# Patient Record
Sex: Female | Born: 1985
Health system: Southern US, Community
[De-identification: ages and names within clinical notes are randomized; demographics above are authoritative.]

## PROBLEM LIST (undated history)

## (undated) DIAGNOSIS — C801 Malignant (primary) neoplasm, unspecified: Secondary | ICD-10-CM

## (undated) DIAGNOSIS — F419 Anxiety disorder, unspecified: Secondary | ICD-10-CM

## (undated) DIAGNOSIS — R519 Headache, unspecified: Secondary | ICD-10-CM

## (undated) HISTORY — DX: Anxiety disorder, unspecified: F41.9

## (undated) HISTORY — DX: Malignant (primary) neoplasm, unspecified: C80.1

## (undated) HISTORY — DX: Headache, unspecified: R51.9

## (undated) HISTORY — PX: OTHER SURGICAL HISTORY: SHX169

---

## 2015-11-05 DIAGNOSIS — C4031 Malignant neoplasm of short bones of right lower limb: Secondary | ICD-10-CM | POA: Insufficient documentation

## 2016-08-17 DIAGNOSIS — G43109 Migraine with aura, not intractable, without status migrainosus: Secondary | ICD-10-CM | POA: Insufficient documentation

## 2019-12-19 DIAGNOSIS — F32A Depression, unspecified: Secondary | ICD-10-CM | POA: Diagnosis not present

## 2019-12-19 DIAGNOSIS — Z6823 Body mass index (BMI) 23.0-23.9, adult: Secondary | ICD-10-CM | POA: Diagnosis not present

## 2020-01-31 DIAGNOSIS — H9201 Otalgia, right ear: Secondary | ICD-10-CM | POA: Diagnosis not present

## 2020-01-31 DIAGNOSIS — H9202 Otalgia, left ear: Secondary | ICD-10-CM | POA: Diagnosis not present

## 2020-01-31 DIAGNOSIS — Z20828 Contact with and (suspected) exposure to other viral communicable diseases: Secondary | ICD-10-CM | POA: Diagnosis not present

## 2020-01-31 DIAGNOSIS — R051 Acute cough: Secondary | ICD-10-CM | POA: Diagnosis not present

## 2020-01-31 DIAGNOSIS — J0100 Acute maxillary sinusitis, unspecified: Secondary | ICD-10-CM | POA: Diagnosis not present

## 2020-02-20 DIAGNOSIS — R102 Pelvic and perineal pain: Secondary | ICD-10-CM | POA: Diagnosis not present

## 2020-03-17 DIAGNOSIS — Z20828 Contact with and (suspected) exposure to other viral communicable diseases: Secondary | ICD-10-CM | POA: Diagnosis not present

## 2020-03-17 DIAGNOSIS — J0100 Acute maxillary sinusitis, unspecified: Secondary | ICD-10-CM | POA: Diagnosis not present

## 2020-03-22 DIAGNOSIS — H6501 Acute serous otitis media, right ear: Secondary | ICD-10-CM | POA: Diagnosis not present

## 2020-04-24 DIAGNOSIS — Z309 Encounter for contraceptive management, unspecified: Secondary | ICD-10-CM | POA: Diagnosis not present

## 2020-04-24 DIAGNOSIS — Z6823 Body mass index (BMI) 23.0-23.9, adult: Secondary | ICD-10-CM | POA: Diagnosis not present

## 2020-04-24 DIAGNOSIS — G43909 Migraine, unspecified, not intractable, without status migrainosus: Secondary | ICD-10-CM | POA: Diagnosis not present

## 2020-04-24 DIAGNOSIS — F32A Depression, unspecified: Secondary | ICD-10-CM | POA: Diagnosis not present

## 2020-05-13 DIAGNOSIS — Z30432 Encounter for removal of intrauterine contraceptive device: Secondary | ICD-10-CM | POA: Diagnosis not present

## 2020-05-13 LAB — HM PAP SMEAR: HM Pap smear: NORMAL

## 2020-05-16 DIAGNOSIS — J206 Acute bronchitis due to rhinovirus: Secondary | ICD-10-CM | POA: Diagnosis not present

## 2020-05-24 DIAGNOSIS — F32A Depression, unspecified: Secondary | ICD-10-CM | POA: Diagnosis not present

## 2020-05-24 DIAGNOSIS — G43909 Migraine, unspecified, not intractable, without status migrainosus: Secondary | ICD-10-CM | POA: Diagnosis not present

## 2020-06-16 DIAGNOSIS — G43009 Migraine without aura, not intractable, without status migrainosus: Secondary | ICD-10-CM | POA: Diagnosis not present

## 2020-07-12 DIAGNOSIS — G43909 Migraine, unspecified, not intractable, without status migrainosus: Secondary | ICD-10-CM | POA: Diagnosis not present

## 2020-07-12 DIAGNOSIS — F410 Panic disorder [episodic paroxysmal anxiety] without agoraphobia: Secondary | ICD-10-CM | POA: Diagnosis not present

## 2020-09-18 DIAGNOSIS — R110 Nausea: Secondary | ICD-10-CM | POA: Diagnosis not present

## 2020-09-18 DIAGNOSIS — Z8669 Personal history of other diseases of the nervous system and sense organs: Secondary | ICD-10-CM | POA: Diagnosis not present

## 2020-10-21 DIAGNOSIS — O30031 Twin pregnancy, monochorionic/diamniotic, first trimester: Secondary | ICD-10-CM | POA: Diagnosis not present

## 2020-10-21 DIAGNOSIS — O3661X0 Maternal care for excessive fetal growth, first trimester, not applicable or unspecified: Secondary | ICD-10-CM | POA: Diagnosis not present

## 2020-10-21 DIAGNOSIS — O3680X0 Pregnancy with inconclusive fetal viability, not applicable or unspecified: Secondary | ICD-10-CM | POA: Diagnosis not present

## 2020-10-21 DIAGNOSIS — Z3201 Encounter for pregnancy test, result positive: Secondary | ICD-10-CM | POA: Diagnosis not present

## 2020-10-21 DIAGNOSIS — Z3A10 10 weeks gestation of pregnancy: Secondary | ICD-10-CM | POA: Diagnosis not present

## 2020-10-21 DIAGNOSIS — Z3A09 9 weeks gestation of pregnancy: Secondary | ICD-10-CM | POA: Diagnosis not present

## 2020-10-21 DIAGNOSIS — Z3481 Encounter for supervision of other normal pregnancy, first trimester: Secondary | ICD-10-CM | POA: Diagnosis not present

## 2020-10-21 DIAGNOSIS — Z3689 Encounter for other specified antenatal screening: Secondary | ICD-10-CM | POA: Diagnosis not present

## 2020-10-21 LAB — OB RESULTS CONSOLE RUBELLA ANTIBODY, IGM: Rubella: NON-IMMUNE/NOT IMMUNE

## 2020-10-21 LAB — OB RESULTS CONSOLE ABO/RH: RH Type: NEGATIVE

## 2020-10-21 LAB — OB RESULTS CONSOLE HEPATITIS B SURFACE ANTIGEN: Hepatitis B Surface Ag: NEGATIVE

## 2020-10-21 LAB — OB RESULTS CONSOLE ANTIBODY SCREEN: Antibody Screen: NEGATIVE

## 2020-10-21 LAB — OB RESULTS CONSOLE RPR: RPR: NONREACTIVE

## 2020-10-21 LAB — OB RESULTS CONSOLE VARICELLA ZOSTER ANTIBODY, IGG: Varicella: IMMUNE

## 2020-10-21 LAB — OB RESULTS CONSOLE HIV ANTIBODY (ROUTINE TESTING): HIV: NONREACTIVE

## 2020-10-29 DIAGNOSIS — O30039 Twin pregnancy, monochorionic/diamniotic, unspecified trimester: Secondary | ICD-10-CM | POA: Insufficient documentation

## 2020-11-19 DIAGNOSIS — O30032 Twin pregnancy, monochorionic/diamniotic, second trimester: Secondary | ICD-10-CM | POA: Diagnosis not present

## 2020-11-19 DIAGNOSIS — Z3A15 15 weeks gestation of pregnancy: Secondary | ICD-10-CM | POA: Diagnosis not present

## 2020-12-13 ENCOUNTER — Encounter: Payer: Self-pay | Admitting: Family Medicine

## 2020-12-13 ENCOUNTER — Other Ambulatory Visit: Payer: Self-pay

## 2020-12-13 ENCOUNTER — Ambulatory Visit (INDEPENDENT_AMBULATORY_CARE_PROVIDER_SITE_OTHER): Payer: BC Managed Care – PPO | Admitting: Family Medicine

## 2020-12-13 VITALS — BP 111/58 | HR 106 | Ht 59.0 in | Wt 130.0 lb

## 2020-12-13 DIAGNOSIS — Z3A18 18 weeks gestation of pregnancy: Secondary | ICD-10-CM

## 2020-12-13 DIAGNOSIS — R002 Palpitations: Secondary | ICD-10-CM | POA: Diagnosis not present

## 2020-12-13 DIAGNOSIS — O0990 Supervision of high risk pregnancy, unspecified, unspecified trimester: Secondary | ICD-10-CM | POA: Insufficient documentation

## 2020-12-13 DIAGNOSIS — O30039 Twin pregnancy, monochorionic/diamniotic, unspecified trimester: Secondary | ICD-10-CM

## 2020-12-13 MED ORDER — ASPIRIN EC 81 MG PO TBEC: 81.0000 mg | DELAYED_RELEASE_TABLET | Freq: Every day | ORAL | 2 refills | Status: DC

## 2020-12-13 NOTE — Progress Notes (Signed)
Subjective:  Bonnie Bell is a [id number] LMP being seen today for her first obstetrical visit. [facility] but wanted to change because of conflicts with that OB.  Her pregnancy is complicated by mErin Hearingtwin pregnancy.  She has had 2 full-term spontaneous vaginal deliveries.  Those pregnancies were uncomplicated, as were the deliveries.  She denies medical problems.  Patient does intend to breast feed. Pregnancy history fully reviewed.  Patient reports  palpitations - has to stop when walking short distances and gets out of breath when walking up stairs. Heart rate climbs into the 150-160s .  BP (!) 111/58   Pulse (!) 106   Ht 4' 11" (1.499 m)   Wt 130 lb (59 kg)   LMP 08/06/2020   BMI 26.26 kg/m   HISTORY: OB History  Gravida Para Term Preterm AB Living  _0 SAB IAB Ectopic Multiple Live Births        1 2    # Outcome Date GA Lbr Len/2nd Weight Sex Delivery Anes PTL Lv  3 Current           2 Term 2019 458w0d M Vag-Spont EPI N LIV  1 Term 2010 [id number] Vag-Spont EPI N LIV    No past medical history on file.  Past Surgical History:  Procedure Laterality Date   surgery on right foot     Cancer    Family History  Problem Relation Age of Onset   Lung cancer Mother    Kidney disease Mother      Exam  BP (!) 111/58   Pulse (!) 106   Ht 4' 11" (1.499 m)   Wt 130 lb (59 kg)   LMP 08/06/2020   BMI 26.26 kg/m   Chaperone present during exam  CONSTITUTIONAL: Well-developed, well-nourished female in no acute distress.  HENT:  Normocephalic, atraumatic, External right and left ear normal. Oropharynx is clear and moist EYES: Conjunctivae and EOM are normal. Pupils are equal, round, and reactive to light. No scleral icterus.  NECK: Normal range of motion, supple, no masses.  Normal thyroid.  CARDIOVASCULAR: Normal heart rate noted, regular rhythm RESPIRATORY: Clear to auscultation bilaterally. Effort and breath  sounds normal, no problems with respiration noted. BREASTS: deferred ABDOMEN: Soft, normal bowel sounds, no distention noted.  No tenderness, rebound or guarding.  PELVIC: deferred MUSCULOSKELETAL: Normal range of motion. No tenderness.  No cyanosis, clubbing, or edema.  2+ distal pulses. SKIN: Skin is warm and dry. No rash noted. Not diaphoretic. No erythema. No pallor. NEUROLOGIC: Alert and oriented to person, place, and time. Normal reflexes, muscle tone coordination. No cranial nerve deficit noted. PSYCHIATRIC: Normal mood and affect. Normal behavior. Normal judgment and thought content.    Assessment:    Pregnancy: [id number] Active Problem List   Diagnosis Date Noted   Supervision of high risk pregnancy, antepartum 12/13/2020      Plan:   1. [redacted] weeks gestation of pregnancy - AFP, Serum, Open Spina Bifida - US KoreaM OB DETAIL ADDL GEST +14 WK; Future  2. Supervision of high risk pregnancy, antepartum FHT and FH normal. Prenatal record reviewed. Start ASA 87m57mUS MKorea OB DETAIL ADDL GEST +14 WK; Future  3. Monochorionic diamniotic twin pregnancy, antepartum Discussed serial US fKorea growth, dopplers for TTSS Discussed increasing dietary protein, increasing caloric intake - US [place] OB DETAIL ADDL GEST +14 WK;  Future  4. Palpitations Will check labs,  - TSH - Comp Met (CMET) - CBC - Magnesium    Continue prenatal vitamins Reviewed n/v relief measures and warning s/s to report Reviewed recommended weight gain based on pre-gravid BMI Encouraged well-balanced diet Ultrasound discussed; fetal survey: requested CCNC completed> form faxed if has or is planning to apply for medicaid The nature of [place] for [employer] with multiple MDs and other Advanced Practice Providers was explained to patient; also emphasized that fellows, residents, and students are part of our team.    Problem list reviewed and updated. 75% of 30 min visit spent on  counseling and coordination of care.     Truett Mainland 12/13/2020

## 2020-12-14 LAB — CBC
Hematocrit: 33 % — ABNORMAL LOW (ref 34.0–46.6)
Hemoglobin: 11 g/dL — ABNORMAL LOW (ref 11.1–15.9)
MCH: 28.9 pg (ref 26.6–33.0)
MCHC: 33.3 g/dL (ref 31.5–35.7)
MCV: 87 fL (ref 79–97)
Platelets: 225 10*3/uL (ref 150–450)
RBC: 3.81 x10E6/uL (ref 3.77–5.28)
RDW: 12.4 % (ref 11.7–15.4)
WBC: 9.4 10*3/uL (ref 3.4–10.8)

## 2020-12-14 LAB — COMPREHENSIVE METABOLIC PANEL
ALT: 16 IU/L (ref 0–32)
AST: 24 IU/L (ref 0–40)
Albumin/Globulin Ratio: 1.7 (ref 1.2–2.2)
Albumin: 3.5 g/dL — ABNORMAL LOW (ref 3.8–4.8)
Alkaline Phosphatase: 54 IU/L (ref 44–121)
BUN/Creatinine Ratio: 9 (ref 9–23)
BUN: 3 mg/dL — ABNORMAL LOW (ref 6–20)
Bilirubin Total: 0.2 mg/dL (ref 0.0–1.2)
CO2: 20 mmol/L (ref 20–29)
Calcium: 9.1 mg/dL (ref 8.7–10.2)
Chloride: 104 mmol/L (ref 96–106)
Creatinine, Ser: 0.32 mg/dL — ABNORMAL LOW (ref 0.57–1.00)
Globulin, Total: 2.1 g/dL (ref 1.5–4.5)
Glucose: 79 mg/dL (ref 70–99)
Potassium: 4.5 mmol/L (ref 3.5–5.2)
Sodium: 136 mmol/L (ref 134–144)
Total Protein: 5.6 g/dL — ABNORMAL LOW (ref 6.0–8.5)
eGFR: 141 mL/min/{1.73_m2} (ref >59)

## 2020-12-14 LAB — TSH: TSH: <0.005 u[IU]/mL — ABNORMAL LOW (ref 0.450–4.500)

## 2020-12-14 LAB — MAGNESIUM: Magnesium: 1.8 mg/dL (ref 1.6–2.3)

## 2020-12-15 LAB — AFP, SERUM, OPEN SPINA BIFIDA
AFP MoM: 1.34
AFP Value: 69.8 ng/mL
Gest. Age on Collection Date: 18.3 weeks
Maternal Age At EDD: 33.4 yr
OSBR Risk 1 IN: 8971
Test Results:: NEGATIVE
Weight: 130 [lb_av]

## 2020-12-16 ENCOUNTER — Telehealth: Payer: Self-pay

## 2020-12-16 ENCOUNTER — Other Ambulatory Visit: Payer: Self-pay

## 2020-12-16 DIAGNOSIS — O30039 Twin pregnancy, monochorionic/diamniotic, unspecified trimester: Secondary | ICD-10-CM | POA: Insufficient documentation

## 2020-12-16 NOTE — Telephone Encounter (Signed)
-----   Message from Truett Mainland, DO sent at 12/16/2020  2:05 PM EDT ----- Can you call the lab and add on free T3 and T4? Thanks!

## 2020-12-16 NOTE — Progress Notes (Signed)
Error

## 2020-12-16 NOTE — Telephone Encounter (Signed)
Spoke to [place]  at [place] T3 and T4 will be added to this pt's labs. Sahej Schrieber l Holston Oyama, CMA

## 2020-12-18 ENCOUNTER — Encounter: Payer: Self-pay | Admitting: General Practice

## 2020-12-18 LAB — T3, FREE: T3, Free: 3.6 pg/mL (ref 2.0–4.4)

## 2020-12-18 LAB — T4, FREE: Free T4: 1.19 ng/dL (ref 0.82–1.77)

## 2020-12-18 LAB — SPECIMEN STATUS REPORT

## 2020-12-20 NOTE — Addendum Note (Signed)
Addended by: Truett Mainland on: 12/20/2020 12:10 PM   Modules accepted: Orders

## 2020-12-25 ENCOUNTER — Encounter: Payer: Self-pay | Admitting: *Deleted

## 2020-12-25 ENCOUNTER — Ambulatory Visit (HOSPITAL_BASED_OUTPATIENT_CLINIC_OR_DEPARTMENT_OTHER): Payer: BC Managed Care – PPO | Admitting: Obstetrics and Gynecology

## 2020-12-25 ENCOUNTER — Other Ambulatory Visit: Payer: Self-pay | Admitting: *Deleted

## 2020-12-25 ENCOUNTER — Other Ambulatory Visit: Payer: Self-pay

## 2020-12-25 ENCOUNTER — Ambulatory Visit: Payer: BC Managed Care – PPO | Admitting: *Deleted

## 2020-12-25 ENCOUNTER — Ambulatory Visit: Payer: BC Managed Care – PPO | Attending: Family Medicine

## 2020-12-25 VITALS — BP 117/66 | HR 101

## 2020-12-25 DIAGNOSIS — Z3A18 18 weeks gestation of pregnancy: Secondary | ICD-10-CM | POA: Insufficient documentation

## 2020-12-25 DIAGNOSIS — Z3689 Encounter for other specified antenatal screening: Secondary | ICD-10-CM

## 2020-12-25 DIAGNOSIS — O43192 Other malformation of placenta, second trimester: Secondary | ICD-10-CM | POA: Insufficient documentation

## 2020-12-25 DIAGNOSIS — Z3A20 20 weeks gestation of pregnancy: Secondary | ICD-10-CM | POA: Insufficient documentation

## 2020-12-25 DIAGNOSIS — O0992 Supervision of high risk pregnancy, unspecified, second trimester: Secondary | ICD-10-CM | POA: Diagnosis not present

## 2020-12-25 DIAGNOSIS — O0990 Supervision of high risk pregnancy, unspecified, unspecified trimester: Secondary | ICD-10-CM

## 2020-12-25 DIAGNOSIS — O30032 Twin pregnancy, monochorionic/diamniotic, second trimester: Secondary | ICD-10-CM

## 2020-12-25 DIAGNOSIS — O30039 Twin pregnancy, monochorionic/diamniotic, unspecified trimester: Secondary | ICD-10-CM | POA: Diagnosis not present

## 2020-12-25 NOTE — Progress Notes (Signed)
Maternal-Fetal Medicine   Name: Bonnie Bell DOB: [birth year] MRN: [id number] Referring Provider: Loma Boston, MD  I had the pleasure of seeing Bonnie Bell today at the [facility]. She is G3 P2 at 20-weeks' gestation with monochorionic-diamniotic twin pregnancy, and is here for fetal anatomy scan.  She was accompanied by her husband. Chorionicity was established and 9-week ultrasound performed at [place].  Obstetric history 2010: Term vaginal delivery of a female infant weighing 6 pounds and 8 ounces at birth. -2019: Term vaginal delivery of a female infant weighing 8 pounds and 10 ounces at birth. Both her children are in good health. GYN history: No history of abnormal Pap smears or cervical surgeries.  No history of breast disease.  Past medical history: No history of diabetes or hypertension or any chronic medical conditions. Past surgical history: Nil of note. Medications: Prenatal vitamins. Allergies: No known drug allergies. Social history: Denies tobacco or drug or alcohol use.  Her partner is Caucasian and he is in good health.  He is a father of her second child.  Ultrasound We confirmed a monochorionic-diamniotic twin pregnancy.  Twin A: Maternal left, transverse lie and head to maternal left, posterior placenta, female fetus.  Fetal growth is appropriate for gestational age.  Amniotic fluid is normal and good fetal activity seen.  No markers of aneuploidies or fetal structural defects are seen.  Fetal bladder is seen well.  Twin B: Maternal right, variable presentation, posterior placenta, female fetus. Fetal growth is appropriate for gestational age.  Amniotic fluid is normal and good fetal activity seen.  No markers of aneuploidies or fetal structural defects are seen.  Fetal bladder is seen well.  Growth discordancy: 1% (normal).  Our concerns include: Monochorionic-diamniotic twin pregnancy: -Explained chorionicity and its  implications.  -Monochorionic twins have a higher rate of complications including miscarriages, congenital malformations, twin to twin transfusion syndrome (TTTS) (15%), selective growth restriction, and fetal demise of one or both twins.  -Twin pregnancies are associated with increased likelihood of gestational diabetes, gestational hypertension, or preeclampsia, malpresentations, cesarean delivery and postpartum hemorrhage.  -Preterm delivery is the most-common complication of twin pregnancies.   -I discussed ultrasound surveillance for TTTS every 2 weeks from now til delivery.  -I discussed timing and mode of delivery. We recommend delivery at 37 weeks in monochorionic twins to prevent the likelihood of stillbirth of one or both twins that is increased in monochorionic twins. Earlier delivery may be indicated if this pregnancy is complicated by fetal growth restriction or other maternal complications.  -In vertex/vertex presentations, vaginal delivery can be safely undertaken. In non-vertex presentation of twin A, we recommend cesarean delivery. If first twin is vertex, and the second twin is non-vertex, either cesarean delivery or vaginal delivery of first twin followed by internal podalic version of second twin may be performed (depends on obstetrician's experience and patient's preference).  Low-dose aspirin is beneficial in preventing preeclampsia.  I counseled the patient that twin pregnancy is associated with increased risk of gestational hypertension/preeclampsia.  Low-dose aspirin prophylaxis delays or prevents development of preeclampsia.    Maternal weight gain improves fetal outcomes by increasing fetal weight.  Marginal cord insertion I explained marginal cord insertion with help of diagrams and ultrasound images.  Marginal cord insertion can be associated with fetal growth restriction.  As per protocol, we will be performing fetal growth assessments every 4  weeks.  Recommendations -An appointment was made for her to return in 2 weeks for TTTS surveillance. -Ultrasound  evaluation every 2 weeks for TTTS surveillance. -Fetal growth assessments every 4 weeks. -Weekly BPP from [redacted] weeks gestation till delivery. -Delivery at [redacted] weeks gestation. -Continue low-dose aspirin prophylaxis.  Thank you for consultation.  If you have any questions or concerns, please contact me the [facility].  Consultation including face-to-face (more than 50%) counseling 45 minutes.

## 2021-01-08 ENCOUNTER — Encounter: Payer: Self-pay | Admitting: *Deleted

## 2021-01-08 ENCOUNTER — Ambulatory Visit: Payer: BC Managed Care – PPO | Attending: Obstetrics and Gynecology

## 2021-01-08 ENCOUNTER — Ambulatory Visit: Payer: BC Managed Care – PPO | Admitting: *Deleted

## 2021-01-08 ENCOUNTER — Other Ambulatory Visit: Payer: Self-pay

## 2021-01-08 VITALS — BP 103/54 | HR 107

## 2021-01-08 DIAGNOSIS — Z3A22 22 weeks gestation of pregnancy: Secondary | ICD-10-CM

## 2021-01-08 DIAGNOSIS — O0990 Supervision of high risk pregnancy, unspecified, unspecified trimester: Secondary | ICD-10-CM | POA: Insufficient documentation

## 2021-01-08 DIAGNOSIS — O30032 Twin pregnancy, monochorionic/diamniotic, second trimester: Secondary | ICD-10-CM

## 2021-01-08 DIAGNOSIS — O43192 Other malformation of placenta, second trimester: Secondary | ICD-10-CM

## 2021-01-15 ENCOUNTER — Ambulatory Visit (INDEPENDENT_AMBULATORY_CARE_PROVIDER_SITE_OTHER): Payer: BC Managed Care – PPO | Admitting: Family Medicine

## 2021-01-15 ENCOUNTER — Other Ambulatory Visit: Payer: Self-pay

## 2021-01-15 VITALS — BP 110/64 | HR 97 | Wt 138.0 lb

## 2021-01-15 DIAGNOSIS — R252 Cramp and spasm: Secondary | ICD-10-CM

## 2021-01-15 DIAGNOSIS — O99891 Other specified diseases and conditions complicating pregnancy: Secondary | ICD-10-CM

## 2021-01-15 DIAGNOSIS — O0990 Supervision of high risk pregnancy, unspecified, unspecified trimester: Secondary | ICD-10-CM

## 2021-01-15 DIAGNOSIS — O30039 Twin pregnancy, monochorionic/diamniotic, unspecified trimester: Secondary | ICD-10-CM

## 2021-01-15 MED ORDER — PANTOPRAZOLE SODIUM 40 MG PO TBEC: 40.0000 mg | DELAYED_RELEASE_TABLET | Freq: Two times a day (BID) | ORAL | 3 refills | Status: DC | PRN

## 2021-01-15 NOTE — Progress Notes (Signed)
   PRENATAL VISIT NOTE  Subjective:  Bonnie Bell is a 35 y.o. G3P2002 at [redacted]w[redacted]d being seen today for ongoing prenatal care.  She is currently monitored for the following issues for this high-risk pregnancy and has Supervision of high risk pregnancy, antepartum and Monochorionic diamniotic twin pregnancy, antepartum on their problem list.  Patient reports  heartburn. Having leg cramps at night.  Taking oral iron for mild anemia.  Contractions: Irritability. Vag. Bleeding: None.  Movement: Present. Denies leaking of fluid.   The following portions of the patient's history were reviewed and updated as appropriate: allergies, current medications, past family history, past medical history, past social history, past surgical history and problem list.   Objective:   Vitals:   01/15/21 1048  BP: 110/64  Pulse: 97  Weight: 138 lb (62.6 kg)    Fetal Status: Fetal Heart Rate (bpm): 138/145   Movement: Present     General:  Alert, oriented and cooperative. Patient is in no acute distress.  Skin: Skin is warm and dry. No rash noted.   Cardiovascular: Normal heart rate noted  Respiratory: Normal respiratory effort, no problems with respiration noted  Abdomen: Soft, gravid, appropriate for gestational age.  Pain/Pressure: Present     Pelvic: Cervical exam deferred        Extremities: Normal range of motion.  Edema: None  Mental Status: Normal mood and affect. Normal behavior. Normal judgment and thought content.   Assessment and Plan:  Pregnancy: G3P2002 at [redacted]w[redacted]d 1. Supervision of high risk pregnancy, antepartum FHT normal.  FH consistent with twins. Start protonix  2. Monochorionic diamniotic twin pregnancy, antepartum 1% discordance EFW 35% No evidence of TTTS Continue [place] q2 week for TTTS Growth q 4 weeks Antenatal testing at 32 weeks Delivery by 37 weeks   3. Leg cramps in pregnancy Will trial magnesium supplement.  Preterm labor symptoms and general obstetric precautions  including but not limited to vaginal bleeding, contractions, leaking of fluid and fetal movement were reviewed in detail with the patient. Please refer to After Visit Summary for other counseling recommendations.   No follow-ups on file.  Future Appointments  Date Time Provider [place]  01/22/2021  1:15 PM [facility] NURSE [facility]  01/22/2021  1:30 PM [facility] US3 [facility]  02/05/2021 11:15 AM [facility] NURSE [facility]  02/05/2021 11:30 AM [facility] US3 [facility]  02/14/2021 10:35 AM Truett Mainland, DO [facility] None  02/19/2021 10:45 AM [facility] NURSE [facility]  02/19/2021 11:00 AM [facility] US1 [facility] [place], DO

## 2021-01-15 NOTE — Patient Instructions (Signed)
Try magnesium supplement for leg cramps

## 2021-01-22 ENCOUNTER — Encounter: Payer: Self-pay | Admitting: *Deleted

## 2021-01-22 ENCOUNTER — Ambulatory Visit: Payer: BC Managed Care – PPO | Attending: Obstetrics and Gynecology

## 2021-01-22 ENCOUNTER — Other Ambulatory Visit: Payer: Self-pay

## 2021-01-22 ENCOUNTER — Ambulatory Visit: Payer: BC Managed Care – PPO | Admitting: *Deleted

## 2021-01-22 VITALS — BP 121/67 | HR 102

## 2021-01-22 DIAGNOSIS — O43192 Other malformation of placenta, second trimester: Secondary | ICD-10-CM | POA: Diagnosis not present

## 2021-01-22 DIAGNOSIS — O0990 Supervision of high risk pregnancy, unspecified, unspecified trimester: Secondary | ICD-10-CM

## 2021-01-22 DIAGNOSIS — O30032 Twin pregnancy, monochorionic/diamniotic, second trimester: Secondary | ICD-10-CM | POA: Diagnosis not present

## 2021-01-22 DIAGNOSIS — M791 Myalgia, unspecified site: Secondary | ICD-10-CM | POA: Diagnosis not present

## 2021-01-22 DIAGNOSIS — Z3A24 24 weeks gestation of pregnancy: Secondary | ICD-10-CM

## 2021-01-22 DIAGNOSIS — R059 Cough, unspecified: Secondary | ICD-10-CM | POA: Diagnosis not present

## 2021-01-22 DIAGNOSIS — J0100 Acute maxillary sinusitis, unspecified: Secondary | ICD-10-CM | POA: Diagnosis not present

## 2021-02-05 ENCOUNTER — Encounter: Payer: Self-pay | Admitting: *Deleted

## 2021-02-05 ENCOUNTER — Other Ambulatory Visit: Payer: Self-pay

## 2021-02-05 ENCOUNTER — Other Ambulatory Visit: Payer: Self-pay | Admitting: *Deleted

## 2021-02-05 ENCOUNTER — Ambulatory Visit: Payer: BC Managed Care – PPO | Admitting: *Deleted

## 2021-02-05 ENCOUNTER — Ambulatory Visit: Payer: BC Managed Care – PPO | Attending: Obstetrics and Gynecology

## 2021-02-05 VITALS — BP 112/63 | HR 95

## 2021-02-05 DIAGNOSIS — O30032 Twin pregnancy, monochorionic/diamniotic, second trimester: Secondary | ICD-10-CM

## 2021-02-05 DIAGNOSIS — Q250 Patent ductus arteriosus: Secondary | ICD-10-CM | POA: Diagnosis not present

## 2021-02-05 DIAGNOSIS — Q2112 Patent foramen ovale: Secondary | ICD-10-CM | POA: Diagnosis not present

## 2021-02-05 DIAGNOSIS — O0990 Supervision of high risk pregnancy, unspecified, unspecified trimester: Secondary | ICD-10-CM | POA: Diagnosis not present

## 2021-02-05 DIAGNOSIS — O43192 Other malformation of placenta, second trimester: Secondary | ICD-10-CM

## 2021-02-05 DIAGNOSIS — Z3A26 26 weeks gestation of pregnancy: Secondary | ICD-10-CM

## 2021-02-05 DIAGNOSIS — O30039 Twin pregnancy, monochorionic/diamniotic, unspecified trimester: Secondary | ICD-10-CM

## 2021-02-10 ENCOUNTER — Encounter: Payer: Self-pay | Admitting: Family Medicine

## 2021-02-11 ENCOUNTER — Inpatient Hospital Stay (HOSPITAL_COMMUNITY)
Admission: AD | Admit: 2021-02-11 | Discharge: 2021-02-11 | Disposition: A | Discharge status: Home / Self Care | Payer: BC Managed Care – PPO | Attending: Obstetrics and Gynecology | Admitting: Obstetrics and Gynecology

## 2021-02-11 ENCOUNTER — Encounter (HOSPITAL_COMMUNITY): Payer: Self-pay | Admitting: Obstetrics and Gynecology

## 2021-02-11 ENCOUNTER — Other Ambulatory Visit: Payer: Self-pay

## 2021-02-11 DIAGNOSIS — O23592 Infection of other part of genital tract in pregnancy, second trimester: Secondary | ICD-10-CM | POA: Diagnosis not present

## 2021-02-11 DIAGNOSIS — O30032 Twin pregnancy, monochorionic/diamniotic, second trimester: Secondary | ICD-10-CM | POA: Diagnosis not present

## 2021-02-11 DIAGNOSIS — B3731 Acute candidiasis of vulva and vagina: Secondary | ICD-10-CM | POA: Diagnosis not present

## 2021-02-11 DIAGNOSIS — O09892 Supervision of other high risk pregnancies, second trimester: Secondary | ICD-10-CM | POA: Diagnosis not present

## 2021-02-11 DIAGNOSIS — Z3A27 27 weeks gestation of pregnancy: Secondary | ICD-10-CM | POA: Diagnosis not present

## 2021-02-11 DIAGNOSIS — R102 Pelvic and perineal pain: Secondary | ICD-10-CM | POA: Insufficient documentation

## 2021-02-11 DIAGNOSIS — O26892 Other specified pregnancy related conditions, second trimester: Secondary | ICD-10-CM | POA: Insufficient documentation

## 2021-02-11 LAB — URINALYSIS, ROUTINE W REFLEX MICROSCOPIC
Bacteria, UA: NONE SEEN
Bilirubin Urine: NEGATIVE
Glucose, UA: NEGATIVE mg/dL
Ketones, ur: NEGATIVE mg/dL
Leukocytes,Ua: NEGATIVE
Nitrite: NEGATIVE
Protein, ur: NEGATIVE mg/dL
Specific Gravity, Urine: 1.011 (ref 1.005–1.030)
pH: 6 (ref 5.0–8.0)

## 2021-02-11 LAB — GC/CHLAMYDIA PROBE AMP (~~LOC~~) NOT AT ARMC
Chlamydia: NEGATIVE
Comment: NEGATIVE
Comment: NORMAL
Neisseria Gonorrhea: NEGATIVE

## 2021-02-11 LAB — WET PREP, GENITAL
Clue Cells Wet Prep HPF POC: NONE SEEN
Sperm: NONE SEEN
Trich, Wet Prep: NONE SEEN
WBC, Wet Prep HPF POC: >=10 — AB (ref <10)

## 2021-02-11 MED ORDER — TERCONAZOLE 0.4 % VA CREA: 1.0000 | TOPICAL_CREAM | Freq: Every day | VAGINAL | 0 refills | Status: DC

## 2021-02-11 NOTE — MAU Note (Signed)
..  Bonnie Bell is a 35 y.o. at [redacted]w[redacted]d here in MAU reporting: a "grabbing" sensation in her pelvis as well as pressure, this pain has bee ongoing for a week but got more intense tonight around 9pm. Denies vaginal bleeding or leaking of fluid. Denies contractions. +FM.  Pain score: 7/10 Vitals:   02/11/21 0359  BP: 104/61  Pulse: (!) 101  Resp: 17  Temp: 97.9 F (36.6 C)  SpO2: 100%     EUM:PNTIR, will listen to in room with other monitors. Lab orders placed from triage: UA

## 2021-02-11 NOTE — MAU Provider Note (Signed)
History     CSN: [id number]  Arrival date and time: 02/11/21 0349   Event Date/Time   First Provider Initiated Contact with Patient 02/11/21 0430      Chief Complaint  Patient presents with   Pelvic Pain   [name] is a 35 y.o. year old G15P2002 female at [redacted]w[redacted]d weeks gestation who presents to MAU reporting feeling a "grabbing sensation in her pelvis and pressure. She endorses feeling this "grabbing" sensation x 1 week, but it increased in intensity since 2100 last night (02/10/21). She rates the pain 7/10. She denies any UC's VB or LOF. She endorses (+) FM x 2. Her high risk pregnancy is complicated by Mono-Di twins. She receives her [facility] with [facility]. Her spouse is present and contributing to the history taking.    OB History     Gravida  3   Para  2   Term  2   Preterm      AB      Living  2      SAB      IAB      Ectopic      Multiple  1   Live Births  2           Past Medical History:  Diagnosis Date   Anxiety    Cancer ([place])    Headache     Past Surgical History:  Procedure Laterality Date   surgery on right foot     Cancer    Family History  Problem Relation Age of Onset   Lung cancer Mother    Kidney disease Mother     Social History   Tobacco Use   Smoking status: Never   Smokeless tobacco: Never  Vaping Use   Vaping Use: Never used  Substance Use Topics   Alcohol use: Never   Drug use: Never    Allergies:  Allergies  Allergen Reactions   Topiramate Other (See Comments)    Hands/feet tingling Hands/feet tingling     Medications Prior to Admission  Medication Sig Dispense Refill Last Dose   aspirin EC 81 MG tablet Take 1 tablet (81 mg total) by mouth daily. Take after 12 weeks for prevention of preeclampsia later in pregnancy 300 tablet 2 02/10/2021   ferrous sulfate 325 (65 FE) MG tablet Take 325 mg by mouth daily with breakfast.   02/10/2021   pantoprazole (PROTONIX) 40 MG tablet Take 1 tablet (40 mg total)  by mouth 2 (two) times daily as needed. 60 tablet 3 02/10/2021   Prenatal Vit-Fe Fumarate-FA (PRENATAL VITAMINS PO) Take by mouth.   02/10/2021    Review of Systems  Constitutional: Negative.   HENT: Negative.    Eyes: Negative.   Respiratory: Negative.    Cardiovascular: Negative.   Gastrointestinal: Negative.   Endocrine: Negative.   Genitourinary:  Positive for pelvic pain ("grabbing" sensation and pressure).  Musculoskeletal: Negative.   Skin: Negative.   Allergic/Immunologic: Negative.   Neurological: Negative.   Hematological: Negative.   Psychiatric/Behavioral: Negative.    Physical Exam   Blood pressure 107/68, pulse 98, temperature 97.9 F (36.6 C), temperature source Oral, resp. rate 17, height 4\' 11"  (1.499 m), weight 64.3 kg, last menstrual period 08/06/2020, SpO2 100 %.  Physical Exam Vitals and nursing note reviewed. Exam conducted with a chaperone present.  Constitutional:      Appearance: Normal appearance. She is normal weight.  Cardiovascular:     Rate and Rhythm: Normal rate.  Pulmonary:  Effort: Pulmonary effort is normal.  Abdominal:     Palpations: Abdomen is soft.  Genitourinary:    General: Normal vulva.     Comments: Pelvic exam: External genitalia normal, scant amt of normal appearing, white vaginal d/c at introitus -- WP, GC/CT done by blind swab, cervix: closed/thick/soft/posterior, Uterus is non-tender, S>D, no CMT or friability, no adnexal tenderness.  Neurological:     Mental Status: She is alert and oriented to person, place, and time.  Psychiatric:        Mood and Affect: Mood normal.        Behavior: Behavior normal.        Thought Content: Thought content normal.        Judgment: Judgment normal.    MAU Course  Procedures  MDM CCUA Wet Prep GC/CT -- Results pending  Results for orders placed or performed during the hospital encounter of 02/11/21 (from the past 24 hour(s))  Urinalysis, Routine w reflex microscopic Urine,  Clean Catch     Status: Abnormal   Collection Time: 02/11/21  4:10 AM  Result Value Ref Range   Color, Urine YELLOW YELLOW   APPearance HAZY (A) CLEAR   Specific Gravity, Urine 1.011 1.005 - 1.030   pH 6.0 5.0 - 8.0   Glucose, UA NEGATIVE NEGATIVE mg/dL   Hgb urine dipstick MODERATE (A) NEGATIVE   Bilirubin Urine NEGATIVE NEGATIVE   Ketones, ur NEGATIVE NEGATIVE mg/dL   Protein, ur NEGATIVE NEGATIVE mg/dL   Nitrite NEGATIVE NEGATIVE   Leukocytes,Ua NEGATIVE NEGATIVE   RBC / HPF 0-5 0 - 5 RBC/hpf   WBC, UA 0-5 0 - 5 WBC/hpf   Bacteria, UA NONE SEEN NONE SEEN   Squamous Epithelial / LPF 0-5 0 - 5   Mucus PRESENT   Wet prep, genital     Status: Abnormal   Collection Time: 02/11/21  4:37 AM  Result Value Ref Range   Yeast Wet Prep HPF POC PRESENT (A) NONE SEEN   Trich, Wet Prep NONE SEEN NONE SEEN   Clue Cells Wet Prep HPF POC NONE SEEN NONE SEEN   WBC, Wet Prep HPF POC >=10 (A) <10   Sperm NONE SEEN     Assessment and Plan  Pelvic pressure in pregnancy, antepartum, second trimester  - Advised to wear maternity support belt during waking hours to help with pelvic support  Candida vaginitis - Rx for Terazol 0.4% vaginal cream insert hs x 7 days - Information provided on vaginal yeast infection   [redacted] weeks gestation of pregnancy   - Discharge patient - Keep scheduled appt with [facility] on Friday 02/14/21 - Patient verbalized an understanding of the plan of care and agrees.    Laury Deep, CNM 02/11/2021, 4:30 AM

## 2021-02-11 NOTE — Discharge Instructions (Signed)
Please make sure you are wearing your maternity support belt during waking hours, drink plenty of water daily and get as much rest as you can.

## 2021-02-14 ENCOUNTER — Ambulatory Visit (INDEPENDENT_AMBULATORY_CARE_PROVIDER_SITE_OTHER): Payer: BC Managed Care – PPO | Admitting: Family Medicine

## 2021-02-14 ENCOUNTER — Other Ambulatory Visit: Payer: Self-pay

## 2021-02-14 VITALS — BP 112/59 | HR 91 | Wt 143.0 lb

## 2021-02-14 DIAGNOSIS — F32A Depression, unspecified: Secondary | ICD-10-CM

## 2021-02-14 DIAGNOSIS — Z6791 Unspecified blood type, Rh negative: Secondary | ICD-10-CM

## 2021-02-14 DIAGNOSIS — R7989 Other specified abnormal findings of blood chemistry: Secondary | ICD-10-CM

## 2021-02-14 DIAGNOSIS — O0992 Supervision of high risk pregnancy, unspecified, second trimester: Secondary | ICD-10-CM

## 2021-02-14 DIAGNOSIS — Z3A27 27 weeks gestation of pregnancy: Secondary | ICD-10-CM

## 2021-02-14 DIAGNOSIS — O0990 Supervision of high risk pregnancy, unspecified, unspecified trimester: Secondary | ICD-10-CM

## 2021-02-14 DIAGNOSIS — O26892 Other specified pregnancy related conditions, second trimester: Secondary | ICD-10-CM

## 2021-02-14 DIAGNOSIS — O30039 Twin pregnancy, monochorionic/diamniotic, unspecified trimester: Secondary | ICD-10-CM

## 2021-02-14 DIAGNOSIS — O26899 Other specified pregnancy related conditions, unspecified trimester: Secondary | ICD-10-CM

## 2021-02-14 DIAGNOSIS — O30032 Twin pregnancy, monochorionic/diamniotic, second trimester: Secondary | ICD-10-CM

## 2021-02-14 DIAGNOSIS — Z23 Encounter for immunization: Secondary | ICD-10-CM

## 2021-02-14 DIAGNOSIS — O99342 Other mental disorders complicating pregnancy, second trimester: Secondary | ICD-10-CM

## 2021-02-14 MED ORDER — BUPROPION HCL ER (XL) 150 MG PO TB24: 150.0000 mg | ORAL_TABLET | Freq: Every day | ORAL | 3 refills | Status: DC

## 2021-02-14 NOTE — Progress Notes (Signed)
   PRENATAL VISIT NOTE  Subjective:  Bonnie Bell is a 35 y.o. G3P2002 at [redacted]w[redacted]d being seen today for ongoing prenatal care.  She is currently monitored for the following issues for this high-risk pregnancy and has Supervision of high risk pregnancy, antepartum and Monochorionic diamniotic twin pregnancy, antepartum on their problem list.  Patient reports  having increased depression, anxiety about caring for twins. Has been on several different antidepressents: zoloft and cymbalta the last time. Doesn't seem to do well on SSRIs. Did okay on Wellbutrin .  Contractions: Irritability. Vag. Bleeding: None.  Movement: Present. Denies leaking of fluid.   The following portions of the patient's history were reviewed and updated as appropriate: allergies, current medications, past family history, past medical history, past social history, past surgical history and problem list.   Objective:   Vitals:   02/14/21 1044  BP: (!) 112/59  Pulse: 91  Weight: 143 lb (64.9 kg)    Fetal Status: Fetal Heart Rate (bpm): A;152B:143   Movement: Present     General:  Alert, oriented and cooperative. Patient is in no acute distress.  Skin: Skin is warm and dry. No rash noted.   Cardiovascular: Normal heart rate noted  Respiratory: Normal respiratory effort, no problems with respiration noted  Abdomen: Soft, gravid, appropriate for gestational age.  Pain/Pressure: Present     Pelvic: Cervical exam deferred        Extremities: Normal range of motion.  Edema: Mild pitting, slight indentation  Mental Status: Normal mood and affect. Normal behavior. Normal judgment and thought content.   Assessment and Plan:  Pregnancy: G3P2002 at [redacted]w[redacted]d 1. Supervision of high risk pregnancy, antepartum FHT and FH normal - Glucose Tolerance, 2 Hours w/1 Hour - CBC - RPR - HIV Antibody (routine testing w rflx)  2. Need for Tdap vaccination - Tdap vaccine greater than or equal to 7yo IM  3. Monochorionic diamniotic twin  pregnancy, antepartum Discordance 1%. EFW 60%/58%.  Last [place] breech/breech  4. Depression affecting pregnancy Start Wellbutrin  5. Rh negative state in antepartum period Patient left prior to rhogam - will give next appt.  6. Low TSH level Had labs drawn prior to office visit - will need recheck next appt.   Preterm labor symptoms and general obstetric precautions including but not limited to vaginal bleeding, contractions, leaking of fluid and fetal movement were reviewed in detail with the patient. Please refer to After Visit Summary for other counseling recommendations.   No follow-ups on file.  Future Appointments  Date Time Provider [place]  02/19/2021 10:45 AM [facility] NURSE [facility]  02/19/2021 11:00 AM [facility] US1 [facility]  02/28/2021  9:15 AM Truett Mainland, DO [facility] None  03/05/2021 10:30 AM [facility] NURSE [facility]  03/05/2021 10:45 AM [facility] US6 [facility]  03/14/2021  8:35 AM Truett Mainland, DO [facility] None  03/20/2021 11:15 AM [facility] NURSE [facility]  03/20/2021 11:30 AM [facility] US3 [facility]  03/27/2021  9:35 AM Nehemiah Settle, Tanna Savoy, DO [facility] None  04/10/2021  4:10 PM Truett Mainland, DO [facility] None  04/17/2021 10:35 AM Nehemiah Settle Tanna Savoy, DO [facility] None    Truett Mainland, DO

## 2021-02-14 NOTE — Progress Notes (Signed)
ROB [redacted]w[redacted]d Flu vaccine declined  T-Dap offered will receive today  PHQ-9=2 GAD 7 =1  Pt getting relief with wearing maternity belt.  Pt notes a little depression.feeling overwhelmed w/ twins coming and other children at home.   EH:MCNO

## 2021-02-15 LAB — CBC
Hematocrit: 32.4 % — ABNORMAL LOW (ref 34.0–46.6)
Hemoglobin: 10.8 g/dL — ABNORMAL LOW (ref 11.1–15.9)
MCH: 29.1 pg (ref 26.6–33.0)
MCHC: 33.3 g/dL (ref 31.5–35.7)
MCV: 87 fL (ref 79–97)
Platelets: 223 10*3/uL (ref 150–450)
RBC: 3.71 x10E6/uL — ABNORMAL LOW (ref 3.77–5.28)
RDW: 13.1 % (ref 11.7–15.4)
WBC: 11.8 10*3/uL — ABNORMAL HIGH (ref 3.4–10.8)

## 2021-02-15 LAB — GLUCOSE TOLERANCE, 2 HOURS W/ 1HR
Glucose, 1 hour: 132 mg/dL (ref 70–179)
Glucose, 2 hour: 127 mg/dL (ref 70–152)
Glucose, Fasting: 77 mg/dL (ref 70–91)

## 2021-02-15 LAB — RPR: RPR Ser Ql: NONREACTIVE

## 2021-02-15 LAB — HIV ANTIBODY (ROUTINE TESTING W REFLEX): HIV Screen 4th Generation wRfx: NONREACTIVE

## 2021-02-17 ENCOUNTER — Encounter: Payer: Self-pay | Admitting: Family Medicine

## 2021-02-19 ENCOUNTER — Ambulatory Visit: Payer: BC Managed Care – PPO | Attending: Obstetrics and Gynecology

## 2021-02-19 ENCOUNTER — Encounter: Payer: Self-pay | Admitting: *Deleted

## 2021-02-19 ENCOUNTER — Other Ambulatory Visit: Payer: Self-pay

## 2021-02-19 ENCOUNTER — Ambulatory Visit: Payer: BC Managed Care – PPO | Admitting: *Deleted

## 2021-02-19 VITALS — BP 115/68 | HR 105

## 2021-02-19 DIAGNOSIS — O0990 Supervision of high risk pregnancy, unspecified, unspecified trimester: Secondary | ICD-10-CM | POA: Insufficient documentation

## 2021-02-19 DIAGNOSIS — O30032 Twin pregnancy, monochorionic/diamniotic, second trimester: Secondary | ICD-10-CM | POA: Diagnosis not present

## 2021-02-19 DIAGNOSIS — O30033 Twin pregnancy, monochorionic/diamniotic, third trimester: Secondary | ICD-10-CM

## 2021-02-19 DIAGNOSIS — Z3A28 28 weeks gestation of pregnancy: Secondary | ICD-10-CM

## 2021-02-20 ENCOUNTER — Other Ambulatory Visit: Payer: Self-pay | Admitting: *Deleted

## 2021-02-20 DIAGNOSIS — O30039 Twin pregnancy, monochorionic/diamniotic, unspecified trimester: Secondary | ICD-10-CM

## 2021-02-28 ENCOUNTER — Ambulatory Visit (INDEPENDENT_AMBULATORY_CARE_PROVIDER_SITE_OTHER): Payer: BC Managed Care – PPO | Admitting: Family Medicine

## 2021-02-28 ENCOUNTER — Other Ambulatory Visit: Payer: Self-pay

## 2021-02-28 VITALS — BP 112/62 | HR 99 | Wt 145.0 lb

## 2021-02-28 DIAGNOSIS — R7989 Other specified abnormal findings of blood chemistry: Secondary | ICD-10-CM | POA: Diagnosis not present

## 2021-02-28 DIAGNOSIS — O30039 Twin pregnancy, monochorionic/diamniotic, unspecified trimester: Secondary | ICD-10-CM

## 2021-02-28 DIAGNOSIS — Z6791 Unspecified blood type, Rh negative: Secondary | ICD-10-CM | POA: Diagnosis not present

## 2021-02-28 DIAGNOSIS — O360930 Maternal care for other rhesus isoimmunization, third trimester, not applicable or unspecified: Secondary | ICD-10-CM | POA: Diagnosis not present

## 2021-02-28 DIAGNOSIS — Z3A29 29 weeks gestation of pregnancy: Secondary | ICD-10-CM | POA: Diagnosis not present

## 2021-02-28 DIAGNOSIS — O35BXX0 Maternal care for other (suspected) fetal abnormality and damage, fetal cardiac anomalies, not applicable or unspecified: Secondary | ICD-10-CM | POA: Insufficient documentation

## 2021-02-28 DIAGNOSIS — O0990 Supervision of high risk pregnancy, unspecified, unspecified trimester: Secondary | ICD-10-CM

## 2021-02-28 DIAGNOSIS — O26899 Other specified pregnancy related conditions, unspecified trimester: Secondary | ICD-10-CM | POA: Insufficient documentation

## 2021-02-28 MED ORDER — RHO D IMMUNE GLOBULIN 1500 UNIT/2ML IJ SOSY
300.0000 ug | PREFILLED_SYRINGE | Freq: Once | INTRAMUSCULAR | Status: AC
  Administered 2021-02-28: 300 ug via INTRAMUSCULAR

## 2021-02-28 NOTE — Progress Notes (Signed)
° °  PRENATAL VISIT NOTE  Subjective:  Bonnie Bell is a 35 y.o. G3P2002 at [redacted]w[redacted]d being seen today for ongoing prenatal care.  She is currently monitored for the following issues for this high risk pregnancy and has Supervision of high risk pregnancy, antepartum and Monochorionic diamniotic twin pregnancy, antepartum on their problem list.  Patient reports  increasing pelvic pressure.  No contractions .  Contractions: Not present. Vag. Bleeding: None.  Movement: Present. Denies leaking of fluid.   The following portions of the patient's history were reviewed and updated as appropriate: allergies, current medications, past family history, past medical history, past social history, past surgical history and problem list.   Objective:   Vitals:   02/28/21 0944  BP: 112/62  Pulse: 99  Weight: 145 lb (65.8 kg)    Fetal Status: Fetal Heart Rate (bpm): 131/136   Movement: Present     General:  Alert, oriented and cooperative. Patient is in no acute distress.  Skin: Skin is warm and dry. No rash noted.   Cardiovascular: Normal heart rate noted  Respiratory: Normal respiratory effort, no problems with respiration noted  Abdomen: Soft, gravid, appropriate for gestational age.  Pain/Pressure: Present     Pelvic: Cervical exam deferred        Extremities: Normal range of motion.  Edema: Trace  Mental Status: Normal mood and affect. Normal behavior. Normal judgment and thought content.   Assessment and Plan:  Pregnancy: G3P2002 at [redacted]w[redacted]d 1. [redacted] weeks gestation of pregnancy - rho (d) immune globulin (RHIG/RHOPHYLAC) injection 300 mcg  2. Supervision of high risk pregnancy, antepartum Fetal heart tones appropriate  3. Monochorionic diamniotic twin pregnancy, antepartum Last growth ultrasound 12/7. Both babies vertex.  Concordant growth at 45% and 33% respectively.  No evidence of twin to twin transfusion syndrome.  4. Rh negative state in antepartum period RhoGAM today - rho (d) immune  globulin (RHIG/RHOPHYLAC) injection 300 mcg  5. Low TSH level Recheck today - TSH - T3 - T4  6. Ventricular septal defect (VSD) of fetus affecting management of pregnancy Tiny mid muscular VSD in twin A.  Fetal echocardiogram otherwise normal.  May close prior to delivery or immediately after delivery.  Baby will need echocardiogram after delivery.  Should not impede delivery at our hospital.   Preterm labor symptoms and general obstetric precautions including but not limited to vaginal bleeding, contractions, leaking of fluid and fetal movement were reviewed in detail with the patient. Please refer to After Visit Summary for other counseling recommendations.   No follow-ups on file.  Future Appointments  Date Time Provider [place]  03/05/2021 10:30 AM [facility] NURSE [facility]  03/05/2021 10:45 AM [facility]MFC US6 [facility]  03/14/2021  8:35 AM Truett Mainland, DO C[facility]-WM[facility] None  03/20/2021 11:15 AM WMC-MFC NURSE W[facility]  03/20/2021 11:30 AM [facility]-MFC US3 [facility]  03/26/2021  9:30 AM WMC-MFC NURSE WMC-MFC [facility]  03/26/2021  9:45 AM W[facility]-MFC US5 [facility]  03/27/2021  9:35 AM Nehemiah Settle, Tanna Savoy, DO C[facility]-WM[facility] None  04/10/2021  4:10 PM Truett Mainland, DO C[facility]-WM[facility] None  04/17/2021 10:35 AM Nehemiah Settle Tanna Savoy, DO C[facility]-WM[facility] None    Truett Mainland, DO

## 2021-03-01 LAB — T3: T3, Total: 190 ng/dL — ABNORMAL HIGH (ref 71–180)

## 2021-03-01 LAB — TSH: TSH: 0.479 u[IU]/mL (ref 0.450–4.500)

## 2021-03-01 LAB — T4: T4, Total: 8.4 ug/dL (ref 4.5–12.0)

## 2021-03-03 ENCOUNTER — Encounter: Payer: Self-pay | Admitting: Family Medicine

## 2021-03-03 MED ORDER — CITALOPRAM HYDROBROMIDE 20 MG PO TABS: 20.0000 mg | ORAL_TABLET | Freq: Every day | ORAL | 12 refills | Status: DC

## 2021-03-05 ENCOUNTER — Ambulatory Visit: Payer: BC Managed Care – PPO | Admitting: *Deleted

## 2021-03-05 ENCOUNTER — Other Ambulatory Visit: Payer: Self-pay

## 2021-03-05 ENCOUNTER — Ambulatory Visit: Payer: BC Managed Care – PPO | Attending: Maternal & Fetal Medicine

## 2021-03-05 ENCOUNTER — Other Ambulatory Visit: Payer: Self-pay | Admitting: *Deleted

## 2021-03-05 VITALS — BP 117/68 | HR 95

## 2021-03-05 DIAGNOSIS — O43192 Other malformation of placenta, second trimester: Secondary | ICD-10-CM | POA: Diagnosis not present

## 2021-03-05 DIAGNOSIS — O43193 Other malformation of placenta, third trimester: Secondary | ICD-10-CM | POA: Diagnosis not present

## 2021-03-05 DIAGNOSIS — O30033 Twin pregnancy, monochorionic/diamniotic, third trimester: Secondary | ICD-10-CM

## 2021-03-05 DIAGNOSIS — O30039 Twin pregnancy, monochorionic/diamniotic, unspecified trimester: Secondary | ICD-10-CM

## 2021-03-05 DIAGNOSIS — Z3A30 30 weeks gestation of pregnancy: Secondary | ICD-10-CM | POA: Diagnosis not present

## 2021-03-05 DIAGNOSIS — O0990 Supervision of high risk pregnancy, unspecified, unspecified trimester: Secondary | ICD-10-CM | POA: Diagnosis not present

## 2021-03-14 ENCOUNTER — Other Ambulatory Visit: Payer: Self-pay

## 2021-03-14 ENCOUNTER — Ambulatory Visit (INDEPENDENT_AMBULATORY_CARE_PROVIDER_SITE_OTHER): Payer: BC Managed Care – PPO | Admitting: Family Medicine

## 2021-03-14 VITALS — BP 109/65 | HR 79 | Temp 97.7°F | Wt 148.0 lb

## 2021-03-14 DIAGNOSIS — O99353 Diseases of the nervous system complicating pregnancy, third trimester: Secondary | ICD-10-CM

## 2021-03-14 DIAGNOSIS — Z6791 Unspecified blood type, Rh negative: Secondary | ICD-10-CM

## 2021-03-14 DIAGNOSIS — R12 Heartburn: Secondary | ICD-10-CM

## 2021-03-14 DIAGNOSIS — O0990 Supervision of high risk pregnancy, unspecified, unspecified trimester: Secondary | ICD-10-CM

## 2021-03-14 DIAGNOSIS — O35BXX0 Maternal care for other (suspected) fetal abnormality and damage, fetal cardiac anomalies, not applicable or unspecified: Secondary | ICD-10-CM

## 2021-03-14 DIAGNOSIS — O30039 Twin pregnancy, monochorionic/diamniotic, unspecified trimester: Secondary | ICD-10-CM

## 2021-03-14 DIAGNOSIS — G5600 Carpal tunnel syndrome, unspecified upper limb: Secondary | ICD-10-CM

## 2021-03-14 DIAGNOSIS — O26899 Other specified pregnancy related conditions, unspecified trimester: Secondary | ICD-10-CM

## 2021-03-14 MED ORDER — WRIST SPLINT/COCK-UP/RIGHT M MISC: 1.0000 [IU] | Freq: Every day | 0 refills | Status: DC

## 2021-03-14 MED ORDER — WRIST SPLINT/COCK-UP/LEFT M MISC: 1.0000 [IU] | Freq: Every day | 0 refills | Status: DC

## 2021-03-14 MED ORDER — FAMOTIDINE 20 MG PO TABS: 20.0000 mg | ORAL_TABLET | Freq: Two times a day (BID) | ORAL | 3 refills | Status: DC

## 2021-03-14 NOTE — Progress Notes (Addendum)
° °  PRENATAL VISIT NOTE  Subjective:  Shannyn Jankowiak is a 35 y.o. G3P2002 at [redacted]w[redacted]d being seen today for ongoing prenatal care.  She is currently monitored for the following issues for this high-risk pregnancy and has Supervision of high risk pregnancy, antepartum; Monochorionic diamniotic twin pregnancy, antepartum; Ventricular septal defect (VSD) of fetus affecting management of pregnancy; and Rh negative state in antepartum period on their problem list.  Patient reports carpal tunnel symptoms.  Contractions: Not present. Vag. Bleeding: None.  Movement: Present. Denies leaking of fluid.   The following portions of the patient's history were reviewed and updated as appropriate: allergies, current medications, past family history, past medical history, past social history, past surgical history and problem list.   Objective:   Vitals:   03/14/21 0839  BP: 109/65  Pulse: 79  Temp: 97.7 F (36.5 C)  Weight: 148 lb (67.1 kg)    Fetal Status: Fetal Heart Rate (bpm): 154/140   Movement: Present     General:  Alert, oriented and cooperative. Patient is in no acute distress.  Skin: Skin is warm and dry. No rash noted.   Cardiovascular: Normal heart rate noted  Respiratory: Normal respiratory effort, no problems with respiration noted  Abdomen: Soft, gravid, appropriate for gestational age.  Pain/Pressure: Present     Pelvic: Cervical exam deferred        Extremities: Normal range of motion.  Edema: Trace  Mental Status: Normal mood and affect. Normal behavior. Normal judgment and thought content.   Assessment and Plan:  Pregnancy: G3P2002 at [redacted]w[redacted]d 1. Supervision of high risk pregnancy, antepartum FHT normal  2. Ventricular septal defect (VSD) of fetus affecting management of pregnancy Small. Will need wcho following delivery  3. Rh negative state in antepartum period S/p rhogam 12/16  4. Monochorionic diamniotic twin pregnancy, antepartum Last growth 12/7 45%/33% (4%  discordance) No TTTS Weekly BPP starting 32 weeks Position Breech/vertex.  Will schedule for cesarean delivery with IUD insertion. Timing of delivery 37 weeks.  5. Pregnancy related carpal tunnel syndrome in third trimester Splints prescribed.  - Elastic Bandages & Supports (WRIST SPLINT/COCK-UP/LEFT M) MISC; 1 Units by Does not apply route at bedtime.  Dispense: 1 each; Refill: 0 - Elastic Bandages & Supports (WRIST SPLINT/COCK-UP/RIGHT M) MISC; 1 Units by Does not apply route at bedtime.  Dispense: 1 each; Refill: 0  6. Heart burn Add pepcid to protonix.   Preterm labor symptoms and general obstetric precautions including but not limited to vaginal bleeding, contractions, leaking of fluid and fetal movement were reviewed in detail with the patient. Please refer to After Visit Summary for other counseling recommendations.   No follow-ups on file.  Future Appointments  Date Time Provider [place]  03/21/2021  8:45 AM WMC[facility]MFC NURSE [facility]  03/21/2021  9:00 AM W[facility]-MFC US7 [facility]  03/26/2021  9:30 AM WMC-MFC NURSE [facility]  03/26/2021  9:45 AM WMC-MFC US5 W[facility]  03/27/2021  9:35 AM Nehemiah Settle, Tanna Savoy, DO C[facility]-WM[facility] None  04/02/2021 10:30 AM WMC-MFC NURSE [facility]  04/02/2021 10:45 AM WMC-MFC US5 WMC-MFCUS [facility]  04/09/2021  9:15 AM WMC-MFC NURSE W[facility]-M[facility]  04/09/2021  9:30 AM WMC-MFC US3 [facility]  04/10/2021  4:10 PM Truett Mainland, DO C[facility]-WM[facility] None  04/16/2021  9:15 AM WMC-MFC NURSE [facility]  04/16/2021  9:30 AM WMC-MFC US3 [facility]  04/17/2021 10:35 AM Janei Scheff, Tanna Savoy, DO C[facility]-WM[facility] None    Truett Mainland, DO

## 2021-03-16 NOTE — L&D Delivery Note (Signed)
OB/GYN Faculty Practice Delivery Note  Karah Caruthers is a 36 y.o. G3P2002 s/p SVD at [redacted]w[redacted]d. She was admitted for IOL due to mono/di twin gestation.   ROM: 4h 9m with clear fluid GBS Status:  Negative/-- (01/26 1627) Maximum Maternal Temperature: afebrile  Labor Progress: Initial SVE: 1/50/-3. She then progressed to complete.   Delivery Date/Time:  Delivery: Called to room and patient was complete and pushing. Head delivered ROA. No nuchal cord present. Shoulder and body delivered in usual fashion. Infant with spontaneous cry, placed on mother's abdomen, dried and stimulated. Cord clamped x 2 after 1-minute delay, and cut by father of baby. Cord blood drawn.   Ultrasound done, confirming vertex presentation of baby B. Felt baby's hand by head and had patient push a couple of times with next couple of contractions. Baby's hand receded. AROM with clear fluid. Head delivered LOA. No nuchal cord. Shoulder and body delivered in usual fashion. Spontaneous cry. Infant placed on mother's abdomen, dried and stimulated. Cord clamped x2 after a minute delay, then cut by father of baby. Cord blood drawn.   Placenta delivered spontaneously with gentle cord traction. Fundus firm with massage and Pitocin. Labia, perineum, vagina, and cervix inspected inspected with first degree laceration.  Baby Weight: pending  Placenta: Sent to pathology Complications: None Lacerations: 1st degree EBL: 350 mL Analgesia: Epidural   Infant:  APGAR (1 MIN):    Lani, Havlik [[id number]]  8    Tvisha, Schwoerer [place] [[id number]]  8   APGAR (5 MINS):    Kylynn, Street [[id number]]  8    Daritza, Brees [[id number]]  9   APGAR (10 MINS):    Maranatha, Grossi [[id number]]     Freddi, Forster [[id number]]       [place] for [place] 04/22/2021, 3:14 PM

## 2021-03-20 ENCOUNTER — Ambulatory Visit: Payer: BC Managed Care – PPO

## 2021-03-21 ENCOUNTER — Ambulatory Visit: Payer: BC Managed Care – PPO | Attending: Maternal & Fetal Medicine | Admitting: *Deleted

## 2021-03-21 ENCOUNTER — Ambulatory Visit (HOSPITAL_BASED_OUTPATIENT_CLINIC_OR_DEPARTMENT_OTHER): Payer: BC Managed Care – PPO

## 2021-03-21 ENCOUNTER — Encounter: Payer: Self-pay | Admitting: *Deleted

## 2021-03-21 ENCOUNTER — Other Ambulatory Visit: Payer: Self-pay

## 2021-03-21 VITALS — BP 114/59 | HR 85

## 2021-03-21 DIAGNOSIS — Z3A32 32 weeks gestation of pregnancy: Secondary | ICD-10-CM | POA: Diagnosis not present

## 2021-03-21 DIAGNOSIS — O321XX2 Maternal care for breech presentation, fetus 2: Secondary | ICD-10-CM

## 2021-03-21 DIAGNOSIS — O30039 Twin pregnancy, monochorionic/diamniotic, unspecified trimester: Secondary | ICD-10-CM

## 2021-03-21 DIAGNOSIS — O0990 Supervision of high risk pregnancy, unspecified, unspecified trimester: Secondary | ICD-10-CM

## 2021-03-21 DIAGNOSIS — O30033 Twin pregnancy, monochorionic/diamniotic, third trimester: Secondary | ICD-10-CM

## 2021-03-21 DIAGNOSIS — O43192 Other malformation of placenta, second trimester: Secondary | ICD-10-CM

## 2021-03-21 DIAGNOSIS — O43123 Velamentous insertion of umbilical cord, third trimester: Secondary | ICD-10-CM | POA: Insufficient documentation

## 2021-03-21 DIAGNOSIS — O321XX1 Maternal care for breech presentation, fetus 1: Secondary | ICD-10-CM | POA: Diagnosis not present

## 2021-03-21 DIAGNOSIS — O360130 Maternal care for anti-D [Rh] antibodies, third trimester, not applicable or unspecified: Secondary | ICD-10-CM | POA: Diagnosis not present

## 2021-03-24 DIAGNOSIS — Q250 Patent ductus arteriosus: Secondary | ICD-10-CM | POA: Diagnosis not present

## 2021-03-24 DIAGNOSIS — Q2112 Patent foramen ovale: Secondary | ICD-10-CM | POA: Diagnosis not present

## 2021-03-24 DIAGNOSIS — O30032 Twin pregnancy, monochorionic/diamniotic, second trimester: Secondary | ICD-10-CM | POA: Diagnosis not present

## 2021-03-26 ENCOUNTER — Encounter: Payer: Self-pay | Admitting: *Deleted

## 2021-03-26 ENCOUNTER — Ambulatory Visit: Payer: BC Managed Care – PPO | Admitting: *Deleted

## 2021-03-26 ENCOUNTER — Other Ambulatory Visit: Payer: Self-pay

## 2021-03-26 ENCOUNTER — Ambulatory Visit: Payer: BC Managed Care – PPO | Attending: Obstetrics

## 2021-03-26 VITALS — BP 117/64 | HR 85

## 2021-03-26 DIAGNOSIS — O0990 Supervision of high risk pregnancy, unspecified, unspecified trimester: Secondary | ICD-10-CM | POA: Diagnosis not present

## 2021-03-26 DIAGNOSIS — Z3A33 33 weeks gestation of pregnancy: Secondary | ICD-10-CM | POA: Diagnosis not present

## 2021-03-26 DIAGNOSIS — O30039 Twin pregnancy, monochorionic/diamniotic, unspecified trimester: Secondary | ICD-10-CM | POA: Insufficient documentation

## 2021-03-26 DIAGNOSIS — O30033 Twin pregnancy, monochorionic/diamniotic, third trimester: Secondary | ICD-10-CM

## 2021-03-27 ENCOUNTER — Encounter: Payer: BC Managed Care – PPO | Admitting: Family Medicine

## 2021-03-27 NOTE — Progress Notes (Signed)
° °  PRENATAL VISIT NOTE  Subjective:  Bonnie Bell is a 36 y.o. G3P2002 at [redacted]w[redacted]d being seen today for ongoing prenatal care.  She is currently monitored for the following issues for this high-risk pregnancy and has Supervision of high risk pregnancy, antepartum; Monochorionic diamniotic twin pregnancy, antepartum; Ventricular septal defect (VSD) of fetus affecting management of pregnancy; Rh negative state in antepartum period; Sarcoma of bone of foot, right ([place]); and Migraine with aura and without status migrainosus, not intractable on their problem list.  Patient reports  tired, poor sleep, and generally uncomfortable .  Contractions: Not present. Vag. Bleeding: None.  Movement: Present. Denies leaking of fluid.   The following portions of the patient's history were reviewed and updated as appropriate: allergies, current medications, past family history, past medical history, past social history, past surgical history and problem list.   Objective:   Vitals:   03/28/21 1103  BP: 115/68  Pulse: 88  Weight: 154 lb (69.9 kg)    Fetal Status: Fetal Heart Rate (bpm): 142/136 Fundal Height: 40 cm Movement: Present     General:  Alert, oriented and cooperative. Patient is in no acute distress.  Skin: Skin is warm and dry. No rash noted.   Cardiovascular: Normal heart rate noted  Respiratory: Normal respiratory effort, no problems with respiration noted  Abdomen: Soft, gravid, appropriate for gestational age.  Pain/Pressure: Present     Pelvic: Cervical exam deferred        Extremities: Normal range of motion.  Edema: Trace  Mental Status: Normal mood and affect. Normal behavior. Normal judgment and thought content.   Assessment and Plan:  Pregnancy: G3P2002 at [redacted]w[redacted]d 1. Ventricular septal defect (VSD) of fetus affecting management of pregnancy Plan is for postnatal echo in nsy or 1 month after delivery Small - Echo done on 1/9  2. Supervision of high risk pregnancy, antepartum Routine  pnc up to date - gbs next time Reviewed questions and expectations for c-section and recovery.   3. Monochorionic diamniotic twin pregnancy, antepartum Growth on 1/6 was normal: A 60% 2105g B 46% 2014g,4% discord Positions are breech/breech - scheduled for c-section at 37w (2/7) with ppIUD Continue weekly BPP/nst until delivery  4. Rh negative state in antepartum period S/p rhogam 12/16  Preterm labor symptoms and general obstetric precautions including but not limited to vaginal bleeding, contractions, leaking of fluid and fetal movement were reviewed in detail with the patient. Please refer to After Visit Summary for other counseling recommendations.   Return in about 2 weeks (around 04/11/2021) for OB VISIT, MD only.  Future Appointments  Date Time Provider [place]  04/02/2021 10:30 AM WMC[facility]MFC NURSE [facility]  04/02/2021 10:45 AM WMC-MFC US5 [facility]  04/09/2021  9:15 AM WMC-MFC NURSE [facility]  04/09/2021  9:30 AM WMC-MFC US3 [facility]  04/10/2021  4:10 PM Truett Mainland, DO CWH-WM[facility] None  04/16/2021  9:15 AM WMC-MFC NURSE WMC-MFC [facility]  04/16/2021  9:30 AM WMC-MFC US3 WMC-M[facility]US [facility]  04/17/2021 10:35 AM [place], Tanna Savoy, DO CWH-WMHP None    Radene Gunning, MD

## 2021-03-28 ENCOUNTER — Other Ambulatory Visit: Payer: Self-pay

## 2021-03-28 ENCOUNTER — Ambulatory Visit (INDEPENDENT_AMBULATORY_CARE_PROVIDER_SITE_OTHER): Payer: BC Managed Care – PPO | Admitting: Obstetrics and Gynecology

## 2021-03-28 ENCOUNTER — Encounter: Payer: Self-pay | Admitting: Obstetrics and Gynecology

## 2021-03-28 VITALS — BP 115/68 | HR 88 | Wt 154.0 lb

## 2021-03-28 DIAGNOSIS — O0990 Supervision of high risk pregnancy, unspecified, unspecified trimester: Secondary | ICD-10-CM

## 2021-03-28 DIAGNOSIS — O26899 Other specified pregnancy related conditions, unspecified trimester: Secondary | ICD-10-CM

## 2021-03-28 DIAGNOSIS — O35BXX0 Maternal care for other (suspected) fetal abnormality and damage, fetal cardiac anomalies, not applicable or unspecified: Secondary | ICD-10-CM

## 2021-03-28 DIAGNOSIS — O30039 Twin pregnancy, monochorionic/diamniotic, unspecified trimester: Secondary | ICD-10-CM

## 2021-03-28 DIAGNOSIS — Z6791 Unspecified blood type, Rh negative: Secondary | ICD-10-CM

## 2021-04-02 ENCOUNTER — Other Ambulatory Visit: Payer: Self-pay

## 2021-04-02 ENCOUNTER — Ambulatory Visit: Payer: BC Managed Care – PPO | Attending: Maternal & Fetal Medicine

## 2021-04-02 ENCOUNTER — Encounter: Payer: Self-pay | Admitting: *Deleted

## 2021-04-02 ENCOUNTER — Ambulatory Visit: Payer: BC Managed Care – PPO | Admitting: *Deleted

## 2021-04-02 VITALS — BP 122/73 | HR 86

## 2021-04-02 DIAGNOSIS — O0990 Supervision of high risk pregnancy, unspecified, unspecified trimester: Secondary | ICD-10-CM | POA: Insufficient documentation

## 2021-04-02 DIAGNOSIS — O30033 Twin pregnancy, monochorionic/diamniotic, third trimester: Secondary | ICD-10-CM | POA: Diagnosis not present

## 2021-04-02 DIAGNOSIS — O43193 Other malformation of placenta, third trimester: Secondary | ICD-10-CM

## 2021-04-02 DIAGNOSIS — Z3A34 34 weeks gestation of pregnancy: Secondary | ICD-10-CM | POA: Diagnosis not present

## 2021-04-02 DIAGNOSIS — O30039 Twin pregnancy, monochorionic/diamniotic, unspecified trimester: Secondary | ICD-10-CM | POA: Insufficient documentation

## 2021-04-07 ENCOUNTER — Other Ambulatory Visit: Payer: Self-pay | Admitting: Family Medicine

## 2021-04-07 DIAGNOSIS — O30039 Twin pregnancy, monochorionic/diamniotic, unspecified trimester: Secondary | ICD-10-CM

## 2021-04-07 MED ORDER — LEVONORGESTREL 20.1 MCG/DAY IU IUD: 1.0000 | INTRAUTERINE_SYSTEM | Freq: Once | INTRAUTERINE | Status: DC

## 2021-04-08 ENCOUNTER — Encounter (HOSPITAL_COMMUNITY): Payer: Self-pay

## 2021-04-08 ENCOUNTER — Telehealth (HOSPITAL_COMMUNITY): Payer: Self-pay | Admitting: *Deleted

## 2021-04-08 NOTE — Telephone Encounter (Signed)
Preadmission screen  

## 2021-04-08 NOTE — Patient Instructions (Signed)
Bonnie Bell  04/08/2021   Your procedure is scheduled on:  04/22/2021  Arrive at [place] at Entrance C on [address] at [facility]  and [address]. You are invited to use the FREE valet parking or use the Visitor's parking deck.  Pick up the phone at the desk and dial [phone].  Call this number if you have problems the morning of surgery: [phone]  Remember:   Do not eat food:(After Midnight) Desps de medianoche.  Do not drink clear liquids: (After Midnight) Desps de medianoche.  Take these medicines the morning of surgery with A SIP OF WATER:  none   Do not wear jewelry, make-up or nail polish.  Do not wear lotions, powders, or perfumes. Do not wear deodorant.  Do not shave 48 hours prior to surgery.  Do not bring valuables to the hospital.  [facility] is not   responsible for any belongings or valuables brought to the hospital.  Contacts, dentures or bridgework may not be worn into surgery.  Leave suitcase in the car. After surgery it may be brought to your room.  For patients admitted to the hospital, checkout time is 11:00 AM the day of              discharge.      Please read over the following fact sheets that you were given:     Preparing for Surgery

## 2021-04-09 ENCOUNTER — Ambulatory Visit: Payer: BC Managed Care – PPO | Attending: Maternal & Fetal Medicine

## 2021-04-09 ENCOUNTER — Other Ambulatory Visit: Payer: Self-pay

## 2021-04-09 ENCOUNTER — Ambulatory Visit: Payer: BC Managed Care – PPO | Admitting: *Deleted

## 2021-04-09 ENCOUNTER — Encounter: Payer: Self-pay | Admitting: *Deleted

## 2021-04-09 VITALS — BP 130/70 | HR 86

## 2021-04-09 DIAGNOSIS — O0990 Supervision of high risk pregnancy, unspecified, unspecified trimester: Secondary | ICD-10-CM | POA: Diagnosis not present

## 2021-04-09 DIAGNOSIS — O30033 Twin pregnancy, monochorionic/diamniotic, third trimester: Secondary | ICD-10-CM | POA: Diagnosis not present

## 2021-04-09 DIAGNOSIS — O30039 Twin pregnancy, monochorionic/diamniotic, unspecified trimester: Secondary | ICD-10-CM | POA: Insufficient documentation

## 2021-04-09 DIAGNOSIS — Z3A35 35 weeks gestation of pregnancy: Secondary | ICD-10-CM

## 2021-04-09 DIAGNOSIS — O43193 Other malformation of placenta, third trimester: Secondary | ICD-10-CM

## 2021-04-10 ENCOUNTER — Ambulatory Visit (INDEPENDENT_AMBULATORY_CARE_PROVIDER_SITE_OTHER): Payer: BC Managed Care – PPO | Admitting: Family Medicine

## 2021-04-10 ENCOUNTER — Other Ambulatory Visit (HOSPITAL_COMMUNITY)
Admission: RE | Admit: 2021-04-10 | Discharge: 2021-04-10 | Disposition: A | Discharge status: Home / Self Care | Payer: BC Managed Care – PPO | Source: Ambulatory Visit | Attending: Family Medicine | Admitting: Family Medicine

## 2021-04-10 VITALS — BP 117/73 | HR 86 | Wt 156.0 lb

## 2021-04-10 DIAGNOSIS — Z6791 Unspecified blood type, Rh negative: Secondary | ICD-10-CM

## 2021-04-10 DIAGNOSIS — O0990 Supervision of high risk pregnancy, unspecified, unspecified trimester: Secondary | ICD-10-CM | POA: Diagnosis not present

## 2021-04-10 DIAGNOSIS — O35BXX0 Maternal care for other (suspected) fetal abnormality and damage, fetal cardiac anomalies, not applicable or unspecified: Secondary | ICD-10-CM

## 2021-04-10 DIAGNOSIS — O30039 Twin pregnancy, monochorionic/diamniotic, unspecified trimester: Secondary | ICD-10-CM | POA: Insufficient documentation

## 2021-04-10 DIAGNOSIS — O0993 Supervision of high risk pregnancy, unspecified, third trimester: Secondary | ICD-10-CM | POA: Diagnosis not present

## 2021-04-10 DIAGNOSIS — O30033 Twin pregnancy, monochorionic/diamniotic, third trimester: Secondary | ICD-10-CM | POA: Diagnosis not present

## 2021-04-10 DIAGNOSIS — Z3A35 35 weeks gestation of pregnancy: Secondary | ICD-10-CM

## 2021-04-10 DIAGNOSIS — O26899 Other specified pregnancy related conditions, unspecified trimester: Secondary | ICD-10-CM

## 2021-04-10 LAB — OB RESULTS CONSOLE GC/CHLAMYDIA: Gonorrhea: NEGATIVE

## 2021-04-10 NOTE — Progress Notes (Signed)
° °  PRENATAL VISIT NOTE  Subjective:  Bonnie Bell is a 36 y.o. G3P2002 at [redacted]w[redacted]d being seen today for ongoing prenatal care.  She is currently monitored for the following issues for this high-risk pregnancy and has Supervision of high risk pregnancy, antepartum; Monochorionic diamniotic twin pregnancy, antepartum; Ventricular septal defect (VSD) of fetus affecting management of pregnancy; Rh negative state in antepartum period; Sarcoma of bone of foot, right ([place]); and Migraine with aura and without status migrainosus, not intractable on their problem list.  Patient reports backache.  Contractions: Irritability. Vag. Bleeding: None.  Movement: Present. Denies leaking of fluid.   The following portions of the patient's history were reviewed and updated as appropriate: allergies, current medications, past family history, past medical history, past social history, past surgical history and problem list.   Objective:   Vitals:   04/10/21 1614  BP: 117/73  Pulse: 86  Weight: 156 lb (70.8 kg)    Fetal Status: Fetal Heart Rate (bpm): 133/141   Movement: Present     General:  Alert, oriented and cooperative. Patient is in no acute distress.  Skin: Skin is warm and dry. No rash noted.   Cardiovascular: Normal heart rate noted  Respiratory: Normal respiratory effort, no problems with respiration noted  Abdomen: Soft, gravid, appropriate for gestational age.  Pain/Pressure: Present     Pelvic: Cervical exam performed in the presence of a chaperone        Extremities: Normal range of motion.  Edema: Trace  Mental Status: Normal mood and affect. Normal behavior. Normal judgment and thought content.   Assessment and Plan:  Pregnancy: G3P2002 at [redacted]w[redacted]d 1. [redacted] weeks gestation of pregnancy - GC/Chlamydia probe amp ([place])not at [facility] - Culture, beta strep (group b only)  2. Supervision of high risk pregnancy, antepartum FHT normal - GC/Chlamydia probe amp ([place])not at [facility] - Culture,  beta strep (group b only)  3. Monochorionic diamniotic twin pregnancy, antepartum Last growth 03/21/21 - 60%/46% 4% discordance BPPs normal - GC/Chlamydia probe amp ([place])not at [facility] - Culture, beta strep (group b only)  4. Rh negative state in antepartum period  5. Ventricular septal defect (VSD) of fetus affecting management of pregnancy Postnatal Echo  Preterm labor symptoms and general obstetric precautions including but not limited to vaginal bleeding, contractions, leaking of fluid and fetal movement were reviewed in detail with the patient. Please refer to After Visit Summary for other counseling recommendations.   No follow-ups on file.  Future Appointments  Date Time Provider [place]  04/16/2021  9:15 AM WMC-MFC NURSE [facility]  04/16/2021  9:30 AM WMC-MFC US3 [facility]  04/17/2021 10:35 AM Truett Mainland, DO CWH-WMHP None  04/21/2021  9:30 AM MC-LD PAT 1 MC-INDC None    Truett Mainland, DO

## 2021-04-11 ENCOUNTER — Telehealth (HOSPITAL_COMMUNITY): Payer: Self-pay | Admitting: *Deleted

## 2021-04-11 NOTE — Telephone Encounter (Signed)
Preadmission screen  

## 2021-04-14 LAB — GC/CHLAMYDIA PROBE AMP (~~LOC~~) NOT AT ARMC
Chlamydia: NEGATIVE
Comment: NEGATIVE
Comment: NORMAL
Neisseria Gonorrhea: NEGATIVE

## 2021-04-14 LAB — CULTURE, BETA STREP (GROUP B ONLY): Strep Gp B Culture: NEGATIVE

## 2021-04-16 ENCOUNTER — Ambulatory Visit: Payer: BC Managed Care – PPO | Admitting: *Deleted

## 2021-04-16 ENCOUNTER — Encounter: Payer: Self-pay | Admitting: *Deleted

## 2021-04-16 ENCOUNTER — Other Ambulatory Visit: Payer: Self-pay | Admitting: Advanced Practice Midwife

## 2021-04-16 ENCOUNTER — Ambulatory Visit: Payer: BC Managed Care – PPO | Attending: Maternal & Fetal Medicine

## 2021-04-16 ENCOUNTER — Other Ambulatory Visit: Payer: Self-pay

## 2021-04-16 VITALS — BP 117/73 | HR 74

## 2021-04-16 DIAGNOSIS — O30033 Twin pregnancy, monochorionic/diamniotic, third trimester: Secondary | ICD-10-CM

## 2021-04-16 DIAGNOSIS — O0990 Supervision of high risk pregnancy, unspecified, unspecified trimester: Secondary | ICD-10-CM | POA: Insufficient documentation

## 2021-04-16 DIAGNOSIS — O30039 Twin pregnancy, monochorionic/diamniotic, unspecified trimester: Secondary | ICD-10-CM | POA: Diagnosis not present

## 2021-04-16 DIAGNOSIS — O43193 Other malformation of placenta, third trimester: Secondary | ICD-10-CM | POA: Diagnosis not present

## 2021-04-16 DIAGNOSIS — Z3A36 36 weeks gestation of pregnancy: Secondary | ICD-10-CM

## 2021-04-17 ENCOUNTER — Ambulatory Visit (INDEPENDENT_AMBULATORY_CARE_PROVIDER_SITE_OTHER): Payer: BC Managed Care – PPO | Admitting: Family Medicine

## 2021-04-17 VITALS — BP 123/71 | HR 76 | Wt 161.0 lb

## 2021-04-17 DIAGNOSIS — O35BXX0 Maternal care for other (suspected) fetal abnormality and damage, fetal cardiac anomalies, not applicable or unspecified: Secondary | ICD-10-CM

## 2021-04-17 DIAGNOSIS — O0990 Supervision of high risk pregnancy, unspecified, unspecified trimester: Secondary | ICD-10-CM

## 2021-04-17 DIAGNOSIS — Z3A36 36 weeks gestation of pregnancy: Secondary | ICD-10-CM

## 2021-04-17 DIAGNOSIS — O30039 Twin pregnancy, monochorionic/diamniotic, unspecified trimester: Secondary | ICD-10-CM

## 2021-04-17 NOTE — Progress Notes (Signed)
° °  PRENATAL VISIT NOTE  Subjective:  Bonnie Bell is a 36 y.o. G3P2002 at [redacted]w[redacted]d being seen today for ongoing prenatal care.  She is currently monitored for the following issues for this high-risk pregnancy and has Supervision of high risk pregnancy, antepartum; Monochorionic diamniotic twin pregnancy, antepartum; Ventricular septal defect (VSD) of fetus affecting management of pregnancy; Rh negative state in antepartum period; Sarcoma of bone of foot, right ([place]); and Migraine with aura and without status migrainosus, not intractable on their problem list.  Patient reports  difficulty sleeping. .  Contractions: Irritability. Vag. Bleeding: None.  Movement: Present. Denies leaking of fluid.   The following portions of the patient's history were reviewed and updated as appropriate: allergies, current medications, past family history, past medical history, past social history, past surgical history and problem list.   Objective:   Vitals:   04/17/21 1030  BP: 123/71  Pulse: 76  Weight: 161 lb (73 kg)    Fetal Status: Fetal Heart Rate (bpm): 120/125   Movement: Present     General:  Alert, oriented and cooperative. Patient is in no acute distress.  Skin: Skin is warm and dry. No rash noted.   Cardiovascular: Normal heart rate noted  Respiratory: Normal respiratory effort, no problems with respiration noted  Abdomen: Soft, gravid, appropriate for gestational age.  Pain/Pressure: Present     Pelvic: Cervical exam deferred        Extremities: Normal range of motion.  Edema: Trace  Mental Status: Normal mood and affect. Normal behavior. Normal judgment and thought content.   Assessment and Plan:  Pregnancy: G3P2002 at [redacted]w[redacted]d 1. Supervision of high risk pregnancy, antepartum  2. Monochorionic diamniotic twin pregnancy, antepartum Normal growth, normal antenatal testing Induce at 37 weeks  3. Ventricular septal defect (VSD) of fetus affecting management of pregnancy Postnatal echo vs 1  month PP echo  4. [redacted] weeks gestation of pregnancy   Preterm labor symptoms and general obstetric precautions including but not limited to vaginal bleeding, contractions, leaking of fluid and fetal movement were reviewed in detail with the patient. Please refer to After Visit Summary for other counseling recommendations.   No follow-ups on file.  Future Appointments  Date Time Provider [place]  04/22/2021 12:00 AM MC-LD [place] MC-INDC None  05/29/2021  3:50 PM Truett Mainland, DO CWH-WMHP None    Truett Mainland, DO

## 2021-04-21 ENCOUNTER — Other Ambulatory Visit: Payer: Self-pay

## 2021-04-21 ENCOUNTER — Other Ambulatory Visit (HOSPITAL_COMMUNITY)
Admission: RE | Admit: 2021-04-21 | Discharge: 2021-04-21 | Disposition: A | Discharge status: Home / Self Care | Payer: BC Managed Care – PPO | Source: Ambulatory Visit | Attending: Family Medicine | Admitting: Family Medicine

## 2021-04-22 ENCOUNTER — Inpatient Hospital Stay (HOSPITAL_COMMUNITY)
Admission: AD | Admit: 2021-04-22 | Discharge: 2021-04-24 | DRG: 807 | Disposition: A | Discharge status: Home / Self Care | Payer: BC Managed Care – PPO | Attending: Family Medicine | Admitting: Family Medicine

## 2021-04-22 ENCOUNTER — Inpatient Hospital Stay (HOSPITAL_COMMUNITY): Payer: BC Managed Care – PPO | Admitting: Anesthesiology

## 2021-04-22 ENCOUNTER — Encounter (HOSPITAL_COMMUNITY): Payer: Self-pay | Admitting: Family Medicine

## 2021-04-22 ENCOUNTER — Encounter (HOSPITAL_COMMUNITY)
Admission: AD | Disposition: A | Discharge status: Home / Self Care | Payer: Self-pay | Source: Home / Self Care | Attending: Family Medicine

## 2021-04-22 ENCOUNTER — Inpatient Hospital Stay (HOSPITAL_COMMUNITY): Payer: BC Managed Care – PPO

## 2021-04-22 DIAGNOSIS — O30033 Twin pregnancy, monochorionic/diamniotic, third trimester: Secondary | ICD-10-CM | POA: Diagnosis not present

## 2021-04-22 DIAGNOSIS — O26893 Other specified pregnancy related conditions, third trimester: Secondary | ICD-10-CM | POA: Diagnosis not present

## 2021-04-22 DIAGNOSIS — Z20822 Contact with and (suspected) exposure to covid-19: Secondary | ICD-10-CM | POA: Diagnosis present

## 2021-04-22 DIAGNOSIS — O99893 Other specified diseases and conditions complicating puerperium: Secondary | ICD-10-CM | POA: Diagnosis not present

## 2021-04-22 DIAGNOSIS — Q210 Ventricular septal defect: Secondary | ICD-10-CM | POA: Diagnosis not present

## 2021-04-22 DIAGNOSIS — Q2112 Patent foramen ovale: Secondary | ICD-10-CM | POA: Diagnosis not present

## 2021-04-22 DIAGNOSIS — O0990 Supervision of high risk pregnancy, unspecified, unspecified trimester: Secondary | ICD-10-CM

## 2021-04-22 DIAGNOSIS — O30039 Twin pregnancy, monochorionic/diamniotic, unspecified trimester: Secondary | ICD-10-CM | POA: Diagnosis not present

## 2021-04-22 DIAGNOSIS — Z23 Encounter for immunization: Secondary | ICD-10-CM

## 2021-04-22 DIAGNOSIS — R339 Retention of urine, unspecified: Secondary | ICD-10-CM | POA: Diagnosis not present

## 2021-04-22 DIAGNOSIS — O35BXX0 Maternal care for other (suspected) fetal abnormality and damage, fetal cardiac anomalies, not applicable or unspecified: Secondary | ICD-10-CM | POA: Diagnosis present

## 2021-04-22 DIAGNOSIS — O26899 Other specified pregnancy related conditions, unspecified trimester: Secondary | ICD-10-CM

## 2021-04-22 DIAGNOSIS — O358XX0 Maternal care for other (suspected) fetal abnormality and damage, not applicable or unspecified: Secondary | ICD-10-CM | POA: Diagnosis not present

## 2021-04-22 DIAGNOSIS — Q5320 Undescended testicle, unspecified, bilateral: Secondary | ICD-10-CM | POA: Diagnosis not present

## 2021-04-22 DIAGNOSIS — O9902 Anemia complicating childbirth: Secondary | ICD-10-CM | POA: Diagnosis not present

## 2021-04-22 DIAGNOSIS — Z6791 Unspecified blood type, Rh negative: Secondary | ICD-10-CM

## 2021-04-22 DIAGNOSIS — Z298 Encounter for other specified prophylactic measures: Secondary | ICD-10-CM | POA: Diagnosis not present

## 2021-04-22 DIAGNOSIS — D649 Anemia, unspecified: Secondary | ICD-10-CM | POA: Diagnosis not present

## 2021-04-22 DIAGNOSIS — Z3A37 37 weeks gestation of pregnancy: Secondary | ICD-10-CM | POA: Diagnosis not present

## 2021-04-22 DIAGNOSIS — R011 Cardiac murmur, unspecified: Secondary | ICD-10-CM | POA: Diagnosis not present

## 2021-04-22 LAB — CBC
HCT: 34 % — ABNORMAL LOW (ref 36.0–46.0)
Hemoglobin: 11 g/dL — ABNORMAL LOW (ref 12.0–15.0)
MCH: 28.1 pg (ref 26.0–34.0)
MCHC: 32.4 g/dL (ref 30.0–36.0)
MCV: 86.7 fL (ref 80.0–100.0)
Platelets: 131 10*3/uL — ABNORMAL LOW (ref 150–400)
RBC: 3.92 MIL/uL (ref 3.87–5.11)
RDW: 14.6 % (ref 11.5–15.5)
WBC: 9.9 10*3/uL (ref 4.0–10.5)
nRBC: 0 % (ref 0.0–0.2)

## 2021-04-22 LAB — RESP PANEL BY RT-PCR (FLU A&B, COVID) ARPGX2
Influenza A by PCR: NEGATIVE
Influenza B by PCR: NEGATIVE
SARS Coronavirus 2 by RT PCR: NEGATIVE

## 2021-04-22 LAB — TYPE AND SCREEN
ABO/RH(D): O NEG
Antibody Screen: POSITIVE

## 2021-04-22 LAB — RPR: RPR Ser Ql: NONREACTIVE

## 2021-04-22 SURGERY — Surgical Case
Anesthesia: Regional

## 2021-04-22 MED ORDER — EPHEDRINE 5 MG/ML INJ: 10.0000 mg | INTRAVENOUS | Status: DC | PRN

## 2021-04-22 MED ORDER — MEDROXYPROGESTERONE ACETATE 150 MG/ML IM SUSP: 150.0000 mg | INTRAMUSCULAR | Status: DC | PRN

## 2021-04-22 MED ORDER — ONDANSETRON HCL 4 MG/2ML IJ SOLN: 4.0000 mg | INTRAMUSCULAR | Status: DC | PRN

## 2021-04-22 MED ORDER — DIBUCAINE (PERIANAL) 1 % EX OINT: 1.0000 "application " | TOPICAL_OINTMENT | CUTANEOUS | Status: DC | PRN

## 2021-04-22 MED ORDER — FLEET ENEMA 7-19 GM/118ML RE ENEM: 1.0000 | ENEMA | RECTAL | Status: DC | PRN

## 2021-04-22 MED ORDER — SIMETHICONE 80 MG PO CHEW: 80.0000 mg | CHEWABLE_TABLET | ORAL | Status: DC | PRN

## 2021-04-22 MED ORDER — ACETAMINOPHEN 325 MG PO TABS
650.0000 mg | ORAL_TABLET | ORAL | Status: DC | PRN
  Administered 2021-04-23 – 2021-04-24 (×5): 650 mg via ORAL
  Filled 2021-04-22 (×5): qty 2

## 2021-04-22 MED ORDER — PHENYLEPHRINE 40 MCG/ML (10ML) SYRINGE FOR IV PUSH (FOR BLOOD PRESSURE SUPPORT): 80.0000 ug | PREFILLED_SYRINGE | INTRAVENOUS | Status: DC | PRN

## 2021-04-22 MED ORDER — ONDANSETRON HCL 4 MG PO TABS: 4.0000 mg | ORAL_TABLET | ORAL | Status: DC | PRN

## 2021-04-22 MED ORDER — OXYCODONE-ACETAMINOPHEN 5-325 MG PO TABS: 2.0000 | ORAL_TABLET | ORAL | Status: DC | PRN

## 2021-04-22 MED ORDER — OXYTOCIN BOLUS FROM INFUSION
333.0000 mL | Freq: Once | INTRAVENOUS | Status: AC
  Administered 2021-04-22: 333 mL via INTRAVENOUS

## 2021-04-22 MED ORDER — LIDOCAINE HCL (PF) 1 % IJ SOLN
30.0000 mL | INTRAMUSCULAR | Status: AC | PRN
  Administered 2021-04-22: 30 mL via SUBCUTANEOUS
  Filled 2021-04-22: qty 30

## 2021-04-22 MED ORDER — LACTATED RINGERS IV SOLN
500.0000 mL | INTRAVENOUS | Status: DC | PRN
  Administered 2021-04-22: 500 mL via INTRAVENOUS

## 2021-04-22 MED ORDER — TERBUTALINE SULFATE 1 MG/ML IJ SOLN: 0.2500 mg | Freq: Once | INTRAMUSCULAR | Status: DC | PRN

## 2021-04-22 MED ORDER — ACETAMINOPHEN 325 MG PO TABS
650.0000 mg | ORAL_TABLET | ORAL | Status: DC | PRN
  Administered 2021-04-22: 650 mg via ORAL
  Filled 2021-04-22: qty 2

## 2021-04-22 MED ORDER — WITCH HAZEL-GLYCERIN EX PADS
1.0000 "application " | MEDICATED_PAD | CUTANEOUS | Status: DC | PRN
  Administered 2021-04-24: 1 via TOPICAL

## 2021-04-22 MED ORDER — CITALOPRAM HYDROBROMIDE 20 MG PO TABS
20.0000 mg | ORAL_TABLET | Freq: Every day | ORAL | Status: DC
  Administered 2021-04-23 – 2021-04-24 (×2): 20 mg via ORAL
  Filled 2021-04-22 (×2): qty 1

## 2021-04-22 MED ORDER — PRENATAL MULTIVITAMIN CH
1.0000 | ORAL_TABLET | Freq: Every day | ORAL | Status: DC
  Administered 2021-04-23 – 2021-04-24 (×2): 1 via ORAL
  Filled 2021-04-22 (×2): qty 1

## 2021-04-22 MED ORDER — LACTATED RINGERS IV SOLN
500.0000 mL | Freq: Once | INTRAVENOUS | Status: AC
  Administered 2021-04-22: 500 mL via INTRAVENOUS

## 2021-04-22 MED ORDER — BENZOCAINE-MENTHOL 20-0.5 % EX AERO
1.0000 "application " | INHALATION_SPRAY | CUTANEOUS | Status: DC | PRN
  Administered 2021-04-23: 1 via TOPICAL
  Filled 2021-04-22: qty 56

## 2021-04-22 MED ORDER — OXYTOCIN-SODIUM CHLORIDE 30-0.9 UT/500ML-% IV SOLN
2.5000 [IU]/h | INTRAVENOUS | Status: DC
  Administered 2021-04-22: 2.5 [IU]/h via INTRAVENOUS
  Filled 2021-04-22: qty 500

## 2021-04-22 MED ORDER — SENNOSIDES-DOCUSATE SODIUM 8.6-50 MG PO TABS
2.0000 | ORAL_TABLET | Freq: Every day | ORAL | Status: DC
  Administered 2021-04-23 – 2021-04-24 (×2): 2 via ORAL
  Filled 2021-04-22 (×2): qty 2

## 2021-04-22 MED ORDER — IBUPROFEN 600 MG PO TABS
600.0000 mg | ORAL_TABLET | Freq: Four times a day (QID) | ORAL | Status: DC
  Administered 2021-04-22 – 2021-04-24 (×8): 600 mg via ORAL
  Filled 2021-04-22 (×8): qty 1

## 2021-04-22 MED ORDER — LIDOCAINE HCL (PF) 1 % IJ SOLN
INTRAMUSCULAR | Status: DC | PRN
  Administered 2021-04-22 (×2): 4 mL via EPIDURAL

## 2021-04-22 MED ORDER — TRANEXAMIC ACID-NACL 1000-0.7 MG/100ML-% IV SOLN: 1000.0000 mg | INTRAVENOUS | Status: AC

## 2021-04-22 MED ORDER — SOD CITRATE-CITRIC ACID 500-334 MG/5ML PO SOLN
30.0000 mL | ORAL | Status: DC | PRN
  Administered 2021-04-22: 30 mL via ORAL
  Filled 2021-04-22: qty 30

## 2021-04-22 MED ORDER — FENTANYL-BUPIVACAINE-NACL 0.5-0.125-0.9 MG/250ML-% EP SOLN
12.0000 mL/h | EPIDURAL | Status: DC | PRN
  Administered 2021-04-22: 10.2 mL/h via EPIDURAL
  Filled 2021-04-22: qty 250

## 2021-04-22 MED ORDER — HYDROXYZINE HCL 50 MG PO TABS: 50.0000 mg | ORAL_TABLET | Freq: Four times a day (QID) | ORAL | Status: DC | PRN

## 2021-04-22 MED ORDER — MEASLES, MUMPS & RUBELLA VAC IJ SOLR: 0.5000 mL | Freq: Once | INTRAMUSCULAR | Status: DC

## 2021-04-22 MED ORDER — FENTANYL CITRATE (PF) 100 MCG/2ML IJ SOLN
50.0000 ug | INTRAMUSCULAR | Status: DC | PRN
  Administered 2021-04-22 (×6): 100 ug via INTRAVENOUS
  Filled 2021-04-22 (×6): qty 2

## 2021-04-22 MED ORDER — MISOPROSTOL 25 MCG QUARTER TABLET
25.0000 ug | ORAL_TABLET | ORAL | Status: DC | PRN
  Administered 2021-04-22: 25 ug via VAGINAL
  Filled 2021-04-22: qty 1

## 2021-04-22 MED ORDER — MISOPROSTOL 25 MCG QUARTER TABLET: 25.0000 ug | ORAL_TABLET | ORAL | Status: DC

## 2021-04-22 MED ORDER — LACTATED RINGERS IV SOLN
INTRAVENOUS | Status: DC
  Administered 2021-04-22: 950 mL via INTRAVENOUS

## 2021-04-22 MED ORDER — DIPHENHYDRAMINE HCL 25 MG PO CAPS: 25.0000 mg | ORAL_CAPSULE | Freq: Four times a day (QID) | ORAL | Status: DC | PRN

## 2021-04-22 MED ORDER — DIPHENHYDRAMINE HCL 50 MG/ML IJ SOLN: 12.5000 mg | INTRAMUSCULAR | Status: DC | PRN

## 2021-04-22 MED ORDER — ZOLPIDEM TARTRATE 5 MG PO TABS: 5.0000 mg | ORAL_TABLET | Freq: Every evening | ORAL | Status: DC | PRN

## 2021-04-22 MED ORDER — FERROUS SULFATE 325 (65 FE) MG PO TABS
325.0000 mg | ORAL_TABLET | ORAL | Status: DC
  Administered 2021-04-22 – 2021-04-24 (×2): 325 mg via ORAL
  Filled 2021-04-22 (×2): qty 1

## 2021-04-22 MED ORDER — ONDANSETRON HCL 4 MG/2ML IJ SOLN
4.0000 mg | Freq: Four times a day (QID) | INTRAMUSCULAR | Status: DC | PRN
  Administered 2021-04-22 (×2): 4 mg via INTRAVENOUS
  Filled 2021-04-22 (×2): qty 2

## 2021-04-22 MED ORDER — OXYCODONE-ACETAMINOPHEN 5-325 MG PO TABS: 1.0000 | ORAL_TABLET | ORAL | Status: DC | PRN

## 2021-04-22 MED ORDER — TETANUS-DIPHTH-ACELL PERTUSSIS 5-2.5-18.5 LF-MCG/0.5 IM SUSY: 0.5000 mL | PREFILLED_SYRINGE | Freq: Once | INTRAMUSCULAR | Status: DC

## 2021-04-22 MED ORDER — TRANEXAMIC ACID-NACL 1000-0.7 MG/100ML-% IV SOLN
INTRAVENOUS | Status: AC
  Administered 2021-04-22: 1000 mg via INTRAVENOUS
  Filled 2021-04-22: qty 100

## 2021-04-22 MED ORDER — COCONUT OIL OIL: 1.0000 "application " | TOPICAL_OIL | Status: DC | PRN

## 2021-04-22 NOTE — Anesthesia Procedure Notes (Signed)
Epidural Patient location during procedure: OB Start time: 04/22/2021 9:45 AM End time: 04/22/2021 9:55 AM  Staffing Anesthesiologist: Josephine Igo, MD Performed: anesthesiologist   Preanesthetic Checklist Completed: patient identified, IV checked, site marked, risks and benefits discussed, surgical consent, monitors and equipment checked, pre-op evaluation and timeout performed  Epidural Patient position: sitting Prep: DuraPrep and site prepped and draped Patient monitoring: continuous pulse ox and blood pressure Approach: midline Location: L3-L4 Injection technique: LOR air  Needle:  Needle type: Tuohy  Needle gauge: 17 G Needle length: 9 cm and 9 Needle insertion depth: 4 cm Catheter type: closed end flexible Catheter size: 19 Gauge Catheter at skin depth: 9 cm Test dose: negative and Other  Assessment Events: blood not aspirated, injection not painful, no injection resistance, no paresthesia and negative IV test  Additional Notes Patient identified. Risks and benefits discussed including failed block, incomplete  Pain control, post dural puncture headache, nerve damage, paralysis, blood pressure Changes, nausea, vomiting, reactions to medications-both toxic and allergic and post Partum back pain. All questions were answered. Patient expressed understanding and wished to proceed. Sterile technique was used throughout procedure. Epidural site was Dressed with sterile barrier dressing. No paresthesias, signs of intravascular injection Or signs of intrathecal spread were encountered.  Patient was more comfortable after the epidural was dosed. Please see RN's note for documentation of vital signs and FHR which are stable. Reason for block:procedure for pain

## 2021-04-22 NOTE — Progress Notes (Signed)
Patient ID: Bonnie Bell, female   DOB: [birth year], 36 y.o.   MRN: [id number]  Reduced ant cervical lip. Patient pushing.  BP 117/79    Pulse (!) 104    Temp 97.7 F (36.5 C) (Oral)    Resp 16    Ht 4\' 9"  (1.448 m)    Wt 73 kg    LMP 08/06/2020    SpO2 100%    BMI 34.82 kg/m   Category tracing 1 x2.  Truett Mainland, DO 04/22/2021 1:12 PM

## 2021-04-22 NOTE — Anesthesia Preprocedure Evaluation (Signed)
Anesthesia Evaluation  Patient identified by MRN, date of birth, ID band Patient awake    Reviewed: Allergy & Precautions, Patient's Chart, lab work & pertinent test results  Airway Mallampati: II  TM Distance: >3 FB Neck ROM: Full    Dental no notable dental hx.    Pulmonary neg pulmonary ROS,    Pulmonary exam normal breath sounds clear to auscultation       Cardiovascular negative cardio ROS Normal cardiovascular exam Rhythm:Regular Rate:Normal     Neuro/Psych  Headaches, Anxiety    GI/Hepatic Neg liver ROS, GERD  ,  Endo/Other  Obesity  Renal/GU negative Renal ROS  negative genitourinary   Musculoskeletal negative musculoskeletal ROS (+)   Abdominal (+) + obese,   Peds  Hematology  (+) Blood dyscrasia, anemia , Thrombocytopenia- mild   Anesthesia Other Findings   Reproductive/Obstetrics (+) Pregnancy Twin Gestation- 37 weeks Di/Di Twins                             Anesthesia Physical Anesthesia Plan  ASA: 2  Anesthesia Plan: Epidural   Post-op Pain Management:    Induction:   PONV Risk Score and Plan:   Airway Management Planned: Natural Airway  Additional Equipment:   Intra-op Plan:   Post-operative Plan:   Informed Consent: I have reviewed the patients History and Physical, chart, labs and discussed the procedure including the risks, benefits and alternatives for the proposed anesthesia with the patient or authorized representative who has indicated his/her understanding and acceptance.       Plan Discussed with: Anesthesiologist  Anesthesia Plan Comments:         Anesthesia Quick Evaluation

## 2021-04-22 NOTE — Progress Notes (Signed)
Patient ID: Bonnie Bell, female   DOB: February 12, 1986, 36 y.o.   MRN: [id number]  Comfortable with epidural. Tired.  BP 117/76    Pulse 67    Temp 97.8 F (36.6 C) (Oral)    Resp 16    Ht 4\' 9"  (1.448 m)    Wt 73 kg    LMP 08/06/2020    SpO2 100%    BMI 34.82 kg/m   A&Ox3 NAD  Dilation: 5 Effacement (%): 80 Cervical Position: [place]: -1 Presentation: Vertex Exam by:: Rodneshia Greenhouse, DO  AROM baby A with clear fluid.  Category 1 tracing x2 Good scalp stim with baby A.  Laboring off cytotec. Anticipate labor continuing with AROM. Anticipate vaginal delivery of both babies.  Truett Mainland, DO

## 2021-04-22 NOTE — Progress Notes (Signed)
S: Patient more uncomfortable with contractions and says the IV pain meds are helping with the pain. Significant other at bedside. Patient has no other questions/ concerns at this time.   O: Vitals:   04/22/21 0020 04/22/21 0230 04/22/21 0315 04/22/21 0432  BP: 121/84  123/83 118/77  Pulse: 80  76 72  Resp: 16  16 16   Temp: 98 F (36.7 C)     TempSrc: Oral     Weight:  73 kg    Height:  4\' 9"  (1.448 m)      FHT:  FHR: Baby A 120 bpm, variability: moderate,  accelerations:  Present,  decelerations:  Absent           FHR: Baby  B: 125 bpm, variability: moderate, accelerations: Present,  decelerations: Absent  UC:   regular, every 1-4 minutes SVE:   Dilation: 3 Effacement (%): 60 Station: -2 Exam by:: [employer], SNM  A / P: 36 y.o. [id number] [redacted]w[redacted]d IOL mono/di twins, progressing normally after 1 dose of vaginal Cytotec   -Expectant management vs Pitocin and patient agrees with the plan. Will start Pitocin in the morning after breakfast.  -GBS negative Fetal Wellbeing:  Category I Pain Control:  Labor support with IV pain medications Anticipated MOD:  NSVD  Christiane Ha, SNM 04/22/2021, 4:59 AM

## 2021-04-22 NOTE — Discharge Summary (Addendum)
Postpartum Discharge Summary      Patient Name: Bonnie Bell DOB: 1985-05-12 MRN: [id number]  Date of admission: 04/22/2021 Delivery date:   Bonnie Bell, Bonnie Bell [[id number]]  04/22/2021    Bonnie Bell, Bonnie Bell [[id number]]  04/22/2021  Delivering provider:    Aoife, Bold [[id number]]  Shakaya, Bhullar [[id number]]  Truett Mainland  Date of discharge: 04/24/2021  Admitting diagnosis: Indication for care in labor or delivery [O75.9] Intrauterine pregnancy: [redacted]w[redacted]d    Secondary diagnosis:  Principal Problem:   Vaginal delivery Active Problems:   Supervision of high risk pregnancy, antepartum   Monochorionic diamniotic twin pregnancy, antepartum   Ventricular septal defect (VSD) of fetus affecting management of pregnancy   Rh negative state in antepartum period Postpartum urinary retention   Additional problems: None    Discharge diagnosis: Term Pregnancy Delivered, twin gestation                                              Post partum procedures: None Augmentation: AROM and Cytotec Complications: None  Hospital course: Induction of Labor With Vaginal Delivery   36y.o. yo G3P2002 at 340w0das admitted to the hospital 04/22/2021 for induction of labor.  Indication for induction:  Mono/Di twins .  Patient had an uncomplicated labor course and progressed to complete after Cytotec and AROM. She had an uncomplicated vaginal delivery. Membrane Rupture Time/Date:    Bonnie Bell, Bonnie Bell[[id number]]10:24 AM    Bonnie Bell[[id number]]2:07 PM ,   Bonnie Bell, Bonnie Bell[[id number]]04/22/2021    Bonnie Bell, Bonnie Bell[[id number]]04/22/2021   Delivery Method:   Bonnie Bell[[id number]]Vaginal, Spontaneous    Bonnie Bell, Bonnie Bell[[id number]]Vaginal, Spontaneous  Episiotomy:    Bonnie Bell, Bonnie Bell[[id number]]None    Bonnie Bell, Bonnie Bell[[id number]]None  Lacerations:     Bonnie Bell, Trimmer0[[id number]]None;1st degree    HoEddye, Broxterman0[[id number]]1st degree  Details of delivery can be found in separate delivery note.  Patient had a routine postpartum course.  She is eating, drinking and ambulating without issue. Of note, when patient's foley catheter was removed (initially placed due to epidural) she had urinary retention with bladder distention and post void residuals around 1000cc. This did not resolve despite straight catheterization and when she spontaneously voided she only voided ~100-200cc. After multiple episodes of this on PPD#1 and 2 she was diagnosed with post partum urinary retention and a foley catheter was placed back in with plan for outpatient removal and voiding trial in 4-5 days. Patient was agreeable with plan and had an appointment set up with Dr. StNehemiah Settleext week in the office. Her urine analysis from a catch in the hat (not clean catch) shower leukocytes and rare bacteria but no nitrites. Since her symptoms resolved after placement of foley catheter she was not started on antibiotics and a urine culture was sent. Patient informed that she would be reached out through MyChart if her culture grew any bacteria and she is in agreement with plan. Patient is discharged home 04/24/21.  Newborn Data: Birth date:   HoTeriana, Danker0[[id number]]04/22/2021    HoTayja, Manzer0[[id number]]04/22/2021  Birth time:   HoAlyna, Stensland0[[id number]]1:53  PM    Alzena, Gerber [place] [[id number]]  2:28 PM  Gender:   Lindyn, Vossler [[id number]]  Female    Dana, Debo [[id number]]  Female  Living status:   Candi, Profit [[id number]]  Living    Peyten, Punches Hatley [[id number]]  Living  Apgars:   Maryse, Brierley [[id number]]  8    Vanna, Sailer [place] [[id number]]  [place] ,   Lestine, Rahe [[id number]]  6    Tiye, Huwe [place] [[id number]]  8  Weight:   Linn, Clavin [[id number]]  5 lb 14.5 oz (2.68 kg)    Ife, Vitelli [[id number]]  5 lb 14.2 oz (2.671 kg)   Magnesium  Sulfate received: No BMZ received: No Rhophylac: Not given, both babies A neg MMR: No T-DaP: Given prenatally Flu: N/A Transfusion: No  Physical exam  Vitals:   04/23/21 2144 04/23/21 2246 04/24/21 0504 04/24/21 1510  BP: 130/85 120/70 123/83 124/77  Pulse: 66 65 68 67  Resp: _0 Temp: 97.8 F (36.6 C)  97.8 F (36.6 C) 98.2 F (36.8 C)  TempSrc: Oral  Oral Oral  SpO2: 100% 100% 99% 100%  Weight:      Height:       General: alert Lochia: appropriate Uterine Fundus: firm Incision: N/A DVT Evaluation: No evidence of DVT seen on physical exam.  Labs: Lab Results  Component Value Date   WBC 9.9 04/22/2021   HGB 11.0 (L) 04/22/2021   HCT 34.0 (L) 04/22/2021   MCV 86.7 04/22/2021   PLT 131 (L) 04/22/2021   CMP Latest Ref Rng & Units 12/13/2020  Glucose 70 - 99 mg/dL 79  BUN 6 - 20 mg/dL 3(L)  Creatinine 0.57 - 1.00 mg/dL 0.32(L)  Sodium 134 - 144 mmol/L 136  Potassium 3.5 - 5.2 mmol/L 4.5  Chloride 96 - 106 mmol/L 104  CO2 20 - 29 mmol/L 20  Calcium 8.7 - 10.2 mg/dL 9.1  Total Protein 6.0 - 8.5 g/dL 5.6(L)  Total Bilirubin 0.0 - 1.2 mg/dL 0.2  Alkaline Phos 44 - 121 IU/L 54  AST 0 - 40 IU/L 24  ALT 0 - 32 IU/L 16   Edinburgh Score: Edinburgh Postnatal Depression Scale Screening Tool 04/24/2021  I have been able to laugh and see the funny side of things. 0  I have looked forward with enjoyment to things. 0  I have blamed myself unnecessarily when things went wrong. 0  I have been anxious or worried for no good reason. 0  I have felt scared or panicky for no good reason. 0  Things have been getting on top of me. 0  I have been so unhappy that I have had difficulty sleeping. 0  I have felt sad or miserable. 0  I have been so unhappy that I have been crying. 0  The thought of harming myself has occurred to me. 0  Edinburgh Postnatal Depression Scale Total 0     After visit meds:  Allergies as of 04/24/2021       Reactions   Topiramate Other (See  Comments)   Hands/feet tingling Hands/feet tingling        Medication List     STOP taking these medications    aspirin EC 81 MG tablet   pantoprazole 40 MG tablet Commonly known as: Protonix   Wrist Splint/Cock-Up/Left M Misc   Wrist Splint/Cock-Up/Right M Misc       TAKE these medications    acetaminophen 500 MG  tablet Commonly known as: TYLENOL Take 2 tablets (1,000 mg total) by mouth every 8 (eight) hours as needed (pain).   citalopram 20 MG tablet Commonly known as: CeleXA Take 1 tablet (20 mg total) by mouth daily.   famotidine 20 MG tablet Commonly known as: PEPCID Take 1 tablet (20 mg total) by mouth 2 (two) times daily.   ferrous sulfate 325 (65 FE) MG tablet Take 325 mg by mouth daily with breakfast.   ibuprofen 600 MG tablet Commonly known as: ADVIL Take 1 tablet (600 mg total) by mouth every 6 (six) hours as needed (pain).   oxyCODONE 5 MG immediate release tablet Commonly known as: Oxy IR/ROXICODONE Take 1 tablet (5 mg total) by mouth every 4 (four) hours as needed for severe pain or breakthrough pain.   PRENATAL VITAMINS PO Take 1 tablet by mouth daily.         Discharge home in stable condition Infant Feeding: Bottle and Breast Infant Disposition: home with mother Discharge instruction: per After Visit Summary and Postpartum booklet. Activity: Advance as tolerated. Pelvic rest for 6 weeks.  Diet: routine diet Future Appointments: Future Appointments  Date Time Provider [place]  04/30/2021  9:30 AM Truett Mainland, DO [facility] None  05/29/2021  3:50 PM Nehemiah Settle Tanna Savoy, DO [facility] None   Follow up Visit: Please schedule this patient for a In person postpartum visit in 4 weeks with the following provider: MD. Additional Postpartum F/U: None   High risk pregnancy complicated by:  Twin gestation Delivery mode:     Eula, Jaster [[id number]]  Vaginal, Spontaneous    Oris, Calmes [[id number]]  Vaginal,  Spontaneous  Anticipated Birth Control:  IUD outpatient   Renard Matter, MD, MPH OB Fellow, Faculty Practice

## 2021-04-22 NOTE — H&P (Addendum)
OB ADMISSION/ HISTORY & PHYSICAL:  Admission Date: 04/22/2021 12:01 AM  Admit Diagnosis: Indication for care in labor or delivery [O75.9]    Bonnie Bell is a 36 y.o. female at 81w0dpresenting for IOL Mono-Di twins. Bonnie Bell reports positive fetal movement, and denies vaginal bleeding and LOF. Patient denies HA, vision changes, and RUQ pain. Anticipating the arrival of two boys [place] Spouse, William at bedside and supportive.  Prenatal History: [id number]  EDC : 05/13/2021, by Last Menstrual Period  Prenatal care at [facility]  Prenatal course complicated by: -Monochorionic diamniotic twin pregnancy -Ventricular septal defect (VSD) of fetus -Rh negative  -Depression  Prenatal Labs:      Nursing Staff Provider  Office Location [facility]  Dating     Language  English  Anatomy [place]    Flu Vaccine  Declined 02/14/21 Genetic/Carrier Screen  NIPS:   LR AFP:   Normal Horizon:  TDaP Vaccine   02/14/21 Hgb A1C or  GTT Early  Third trimester - 2hr GTT  COVID Vaccine No    LAB RESULTS   Rhogam  02/28/21 Blood Type O/Negative/-- (08/08 0000)   Baby Feeding Plan Breast  Antibody Negative (08/08 0000)  Contraception IUD Rubella Nonimmune (08/08 0000)  Circumcision Yes  RPR Non Reactive (12/02 0833)   Pediatrician  [facility]  HBsAg Negative (08/08 0000)   Support Person William(FOB) HCVAb    Prenatal Classes   HIV Non Reactive (12/02 03428     BTL Consent   GBS Negative/-- (01/26 1627)neg  VBAC Consent   Pap             DME Rx '[ ]'  BP cuff '[ ]'  Weight Scale Waterbirth  '[ ]'  Class '[ ]'  Consent '[ ]'  CNM visit  PHQ9 & GAD7 [  ] new OB [ x ] 28 weeks  [  ] 36 weeks Induction  '[ ]'  Orders Entered '[ ]' Foley Y/N   Medical / Surgical History : Past medical history:  Past Medical History:  Diagnosis Date   Anxiety    Cancer (HAztec    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 She has never used smokeless tobacco. She reports that she does not drink alcohol and does not use drugs. Allergies: Topiramate   Current Medications at time of admission:  Medications Prior to Admission  Medication Sig Dispense Refill Last Dose   aspirin EC 81 MG tablet Take 1 tablet (81 mg total) by mouth daily. Take after 12 weeks for prevention of preeclampsia later in pregnancy 300 tablet 2 2/3 at 0800   citalopram (CELEXA) 20 MG tablet Take 1 tablet (20 mg total) by mouth daily. 30 tablet 12 Past Week at 0800   famotidine (PEPCID) 20 MG tablet Take 1 tablet (20 mg total) by mouth 2 (two) times daily. 60 tablet 3 04/21/2021 at 1000   ferrous sulfate 325 (65 FE) MG tablet Take 325 mg by mouth daily with breakfast.   04/21/2021 at 1000   pantoprazole (PROTONIX) 40 MG tablet Take 1 tablet (40 mg total) by mouth 2 (two) times daily as needed. 60 tablet 3  04/21/2021 at 1000   Prenatal Vit-Fe Fumarate-FA (PRENATAL VITAMINS PO) Take by mouth.   04/21/2021 at 1000   Elastic Bandages & Supports (WRIST SPLINT/COCK-UP/LEFT M) MISC 1 Units by Does not apply route at bedtime. 1 each 0    Elastic Bandages & Supports (WRIST SPLINT/COCK-UP/RIGHT M) MISC 1 Units by Does not apply route at bedtime. 1 each 0      Review of Systems: Review of Systems  Constitutional: Negative.   HENT: Negative.    Eyes: Negative.   Respiratory: Negative.    Cardiovascular: Negative.   Musculoskeletal: Negative.   Neurological: Negative.   Psychiatric/Behavioral: Negative.     Physical Exam: Vital signs and nursing notes reviewed.  No data found.   General: AAO x 3, NAD Heart: RRR Lungs:CTAB Abdomen: Gravid, NT Extremities: no edema Genitalia / VE: Dilation: 1 Effacement (%): 50 Cervical Position: Middle Station: -3 Exam by:: [employer],  SNM   FHR: Baby A: 130 BPM, moderate variability, present accels, no decels          Baby B: 130 BPM, moderate variability, present accels, no decels TOCO: rare contractions   Labs:   Pending T&S, CBC, RPR  Recent Labs    04/22/21 0121  WBC 9.9  HGB 11.0*  HCT 34.0*  PLT 131*    Assessment:  36 y.o. G3P2002 at 34w0dIOL Mono-Di twins.   1. 1st stage of labor 2. FHR category 1 3. GBS negative 4. Desires epidural 5. Plans to breastfeed  Plan:  1. Admit to BS 2. Routine L&D orders 3. Analgesia/anesthesia PRN  4. Cytotec- cervical ripening 5. Anticipate NSVB 6. Undecided on contraception postpartum 7. Planning circ prior to discharge  [place]2/09/2021, 12:57 AM  I personally saw and evaluated the patient, performing the key elements of the service. I developed and verified the management plan that is described in the resident's/student's note, and I agree with the content with my edits above. VSS, HRR&R, Resp unlabored, Legs neg.  FNigel Berthold CNM 04/22/2021 7:47 AM

## 2021-04-22 NOTE — Lactation Note (Signed)
This note was copied from a baby's chart. Lactation Consultation Note  Patient Name: Bonnie Bell UKGUR'K Date: 04/22/2021 Reason for consult: Initial assessment;Early term 37-38.6wks;Infant < 6lbs;Multiple gestation Age:34 hours Mom's current feeding plan is breastfeeding and supplementing with Similac 22 kcal Neosure. LC entered room, twins asleep in basinet. Per mom, RN assisted with latching infant on breast at 1710 pm: Baby A breastfeed for 10 minutes and was supplemented with 7 mls of formula. Baby B breastfeed for 10 minutes and was supplemented with 10 mls of formula.  Due to twins recently been BF and supplemented with formula LC did not assist with  latch at this time Mom was set up with DEBP and understands how to use it, she is very tired and plans to use the DEBP later tonight. Mom knows to call [facility] services if she needs additional assistance with latching twins at the breast. Mom shown how to use DEBP & how to disassemble, clean, & reassemble parts.  Mom made aware of O/P services, breastfeeding support groups, community resources, and our phone # for post-discharge questions.   Mom's plan: 1-Mom knows to breastfeed twins according to feeding cues, 8 to 12+ or more times within 24 hours. 2-Parents will do as much skin to skin with infants ( twins) as possible. 3- After latching twins at breast mom will supplement twins with any EBM first and then offer formula at each feeding. 4- When mom is ready tonight she will start pumping every 3 hours for 15 minutes on initial setting.  Maternal Data Does the patient have breastfeeding experience prior to this delivery?: Yes How long did the patient breastfeed?: Per mom, she did not BF her 1st child but with her 2nd child she BF for 2 weeks, her 2nd child is now 12 years old.  Feeding Mother's Current Feeding Choice: Breast Milk and Formula (Twins are being supplemented with Similac Neosure 22 kcal with iron.)  LATCH Score                     Lactation Tools Discussed/Used Tools: Pump Breast pump type: Double-Electric Breast Pump Pump Education: Setup, frequency, and cleaning;Milk Storage Reason for Pumping: RN set mom up with DEBP, twins, ETI, less than 6 lbs at birth. Pumping frequency: Mom understands to pump every 3 hours for 15 minutes on inital setting.  Interventions Interventions: Breast feeding basics reviewed;Skin to skin;Position options;DEBP;LC Services brochure  Discharge    Consult Status Consult Status: Follow-up Date: 04/23/21 Follow-up type: In-patient    Vicente Serene 04/22/2021, 7:03 PM

## 2021-04-23 MED ORDER — MEASLES, MUMPS & RUBELLA VAC IJ SOLR
0.5000 mL | INTRAMUSCULAR | Status: AC | PRN
Start: 2021-04-23 — End: 2021-04-24
  Administered 2021-04-24: 0.5 mL via SUBCUTANEOUS
  Filled 2021-04-23: qty 0.5

## 2021-04-23 NOTE — Lactation Note (Signed)
This note was copied from a baby's chart. Lactation Consultation Note  Patient Name: Bonnie Bell VOHYW'V Date: 04/23/2021  [facility] was about to enter room when RN came out and said mom was about to rest/sleep.    RN will call out for lactation when mom is ready for a visit.     Age:34 hours  Maternal Data    Feeding    LATCH Score Latch: Grasps breast easily, tongue down, lips flanged, rhythmical sucking.  Audible Swallowing: None  Type of Nipple: Everted at rest and after stimulation  Comfort (Breast/Nipple): Soft / non-tender  Hold (Positioning): Assistance needed to correctly position infant at breast and maintain latch.  LATCH Score: 7   Lactation Tools Discussed/Used    Interventions    Discharge    Consult Status      [name] [facility] 04/23/2021, 10:22 AM

## 2021-04-23 NOTE — Lactation Note (Signed)
This note was copied from a baby's chart. Lactation Consultation Note  Patient Name: Bonnie Bell [id number] Date: 04/23/2021 Reason for consult: Follow-up assessment;Early term 37-38.6wks;Multiple gestation;Infant < 6lbs Age:34 hours   P4 mother whose infant twin boys are now 11 hours old.  These are early term infants born at 37+0 weeks weighing < 6 lbs.  Mother did not breast feed her first child (now 6 years old) and breast fed her second child (now 20 years old) for 2 weeks.  Her current feeding preference is breast/formula.  Mother requested latch assistance.  Suggested mother feed STS and mother receptive.  Father removed baby "B" Greyson's sleeper.  Mother demonstrated hand expression and I finger fed colostrum drops to him.  Assisted to latch in the football hold.  He remained sleepy, however, did feed on/off with gentle stimulation.  After 10 minutes father supplemented with Similac 22 calorie formula using the white Nfant nipple.  Burped and placed in bassinet.  Baby "A" (Jamison" was waking and I also finger fed colostrum drops to him prior to latching.  He latched easily in the football hold and fed for 16 minutes with gentle stimulation.  Few swallows noted.  Parents informed me that he has been the stronger one with breast feeding.  Father supplemented with Similac 22 calorie formula using the white Nfant nipple.  Baby consumed 12 mls and burped.  Father placed him in bassinet.    Mother had a DEBP at bedside, however, she has not pumped.   She informed me that she had not been instructed with use.  Pump parts, assembly and cleaning reviewed.  Mother is currently using the #24 flange size.  Observed her pumping for 15 minutes.  2 colostrum drops noted and a drop fed to each baby.  Encouraged mother to call her RN/LC for latch assistance as needed.  Suggested parents rest now.  RN updated.   Maternal Data Has patient been taught Hand Expression?: Yes Does the patient have  breastfeeding experience prior to this delivery?: Yes How long did the patient breastfeed?: 2 weeks with her second child  Feeding Mother's Current Feeding Choice: Breast Milk and Formula Nipple Type: Nfant Standard Flow (white)  LATCH Score Latch: Repeated attempts needed to sustain latch, nipple held in mouth throughout feeding, stimulation needed to elicit sucking reflex.  Audible Swallowing: None  Type of Nipple: Everted at rest and after stimulation  Comfort (Breast/Nipple): Soft / non-tender  Hold (Positioning): Assistance needed to correctly position infant at breast and maintain latch.  LATCH Score: 6   Lactation Tools Discussed/Used    Interventions Interventions: Breast feeding basics reviewed;Assisted with latch;Skin to skin;Hand express;Breast massage;Breast compression;Adjust position;DEBP;Hand pump;Position options;Support pillows;Education  Discharge Pump: Personal;Manual;DEBP (Spectra)  Consult Status Consult Status: Follow-up Date: 04/24/21 Follow-up type: In-patient    [name] 04/23/2021, 3:47 PM

## 2021-04-23 NOTE — Anesthesia Postprocedure Evaluation (Signed)
Anesthesia Post Note  Patient: Bonnie Bell  Procedure(s) Performed: AN AD HOC LABOR EPIDURAL     Patient location during evaluation: Mother Baby Anesthesia Type: Epidural Level of consciousness: awake, oriented and awake and alert Pain management: pain level controlled Vital Signs Assessment: post-procedure vital signs reviewed and stable Respiratory status: respiratory function stable, spontaneous breathing and nonlabored ventilation Cardiovascular status: stable Postop Assessment: no headache, adequate PO intake, able to ambulate, patient able to bend at knees and no apparent nausea or vomiting Anesthetic complications: no   No notable events documented.  Last Vitals:  Vitals:   04/23/21 0117 04/23/21 0522  BP: 118/79 117/75  Pulse: 65 66  Resp: 18 18  Temp: 36.4 C 36.7 C  SpO2: 100% 100%    Last Pain:  Vitals:   04/23/21 0827  TempSrc:   PainSc: 0-No pain   Pain Goal:                Epidural/Spinal Function Cutaneous sensation: Normal sensation (04/23/21 0827), Patient able to flex knees: Yes (04/23/21 0827), Patient able to lift hips off bed: Yes (04/23/21 0827), Back pain beyond tenderness at insertion site: No (04/23/21 0827), Progressively worsening motor and/or sensory loss: No (04/23/21 0827), Bowel and/or bladder incontinence post epidural: No (04/23/21 0827)  Keatyn Luck

## 2021-04-23 NOTE — Progress Notes (Signed)
Post Partum Day 1 Subjective: Doing well. No acute events overnight. Pain is controlled and bleeding is appropriate. She is eating, drinking, voiding, and ambulating without issue. She is working on breastfeeding which is going well. She has supplemented some with formula. She has no other concerns at this time.  Objective: Blood pressure 117/75, pulse 66, temperature 98 F (36.7 C), temperature source Oral, resp. rate 18, height 4\' 9"  (1.448 m), weight 73 kg, last menstrual period 08/06/2020, SpO2 100 %, unknown if currently breastfeeding.  Physical Exam:  General: alert, cooperative, and no distress Lochia: appropriate Uterine Fundus: firm and below umbilicus  DVT Evaluation: no LE edema or calf tenderness to palpation   Recent Labs    04/22/21 0121  HGB 11.0*  HCT 34.0*    Assessment/Plan: Bonnie Bell is a 36 y.o. G3P3004 on PPD# 1 s/p SVD.  Progressing well. Meeting postpartum milestones. VSS. Continue routine postpartum care.  Feeding: Breast and formula  Contraception: IUD outpatient  Circumcision: Desires, consented at bedside this AM  Dispo: Plan for discharge on PPD#2.   LOS: 1 day   [name], MD  04/23/2021, 7:00 AM

## 2021-04-23 NOTE — Social Work (Signed)
MOB was referred for history of depression.   * Referral screened out by Clinical Social Worker because none of the following criteria appear to apply: ~ History of anxiety/depression during this pregnancy, or of post-partum depression following prior delivery. ~ Diagnosis of anxiety and/or depression within last 3 years OR * MOB's symptoms currently being treated with medication and/or therapy.Per chart review, MOB takes Celexa 20mg  for depression symptoms.   Please contact the Clinical Social Worker if needs arise, by [facility] request, or if MOB scores greater than 9/yes to question 10 on Edinburgh Postpartum Depression Screen.   Kathrin Greathouse, MSW, LCSW Women's and [place] Worker  [phone] 04/23/2021  2:16 PM

## 2021-04-24 LAB — URINALYSIS, ROUTINE W REFLEX MICROSCOPIC
Bilirubin Urine: NEGATIVE
Glucose, UA: NEGATIVE mg/dL
Ketones, ur: NEGATIVE mg/dL
Nitrite: NEGATIVE
Protein, ur: NEGATIVE mg/dL
Specific Gravity, Urine: 1.005 (ref 1.005–1.030)
pH: 6 (ref 5.0–8.0)

## 2021-04-24 MED ORDER — ACETAMINOPHEN 500 MG PO TABS: 1000.0000 mg | ORAL_TABLET | Freq: Three times a day (TID) | ORAL | 0 refills | Status: DC | PRN

## 2021-04-24 MED ORDER — IBUPROFEN 600 MG PO TABS: 600.0000 mg | ORAL_TABLET | Freq: Four times a day (QID) | ORAL | 0 refills | Status: DC | PRN

## 2021-04-24 MED ORDER — OXYCODONE HCL 5 MG PO TABS: 5.0000 mg | ORAL_TABLET | ORAL | 0 refills | Status: DC | PRN

## 2021-04-24 MED ORDER — OXYCODONE HCL 5 MG PO TABS
5.0000 mg | ORAL_TABLET | ORAL | Status: DC | PRN
  Administered 2021-04-24: 5 mg via ORAL
  Filled 2021-04-24 (×2): qty 1

## 2021-04-24 NOTE — Progress Notes (Signed)
Patient complained of increase frequency and urgency with voiding and inability to fully empty her bladder. At 2147 patient was bladder scanned and found to have >950mL in her bladder. Patient was then straight catheterized at 2240 and 124mL was removed. Patient was educated to void within the next few hours and to call out once she did. Patient voided around [place] and reported she was better able to empty her bladder. She was bladder scanned again for residual and found to have over 585mL still in her bladder. Another straight catheterization was attempted but unsuccessful due to patient's edema in perineum. Patient was tearful due to increased pain during catheterization and requested stronger pain medication than what was ordered. I called Dr. Gwenlyn Perking at [phone] in order to request this pain medication and receive guidance on next steps regarding this situation. Dr. Gwenlyn Perking ordered the pain medication (see Orders) and advised me to wait until 0200 and see if the patient was able to void on her own. If she voided on her own before 0200 and stated that she felt she had emptied her bladder sufficiently, Dr, Gwenlyn Perking advised against straight catheterizing again. If the patient did not void by 0200, she recommended bladder scanning again. If the scan resulted in more that 525mls, she advised straight catheterizing the patient again. I share this plan with the patient, and she stated that she had voided around 0125 and felt she had emptied her bladder sufficiently. Patient has since voided several times and states she is not experiencing the same symptoms she was before the first straight catheterization.

## 2021-04-24 NOTE — Lactation Note (Addendum)
This note was copied from a baby's chart. Lactation Consultation Note  Patient Name: Bonnie Bell [id number] Date: 04/24/2021 Reason for consult: Follow-up assessment;Multiple gestation;Early term 37-38.6wks;Infant < 6lbs Age:33 hours  Babies in nursery being circumcised.  Mom states she has relief now with the catheter in place.    LC discussed the importance of pumping consistently in order to stimulate her milk supply.    Mom states she will begin pumping at home. LC provided education about early stimulation but expresses understanding that mom has been through quite a bit today with bladder retention.  Mom will go home with a cath.  Mom has spoken with  an [place] with [employer] during pregnancy and plans to meet with her over zoom after delivery.  She also has an [place] at her ped. Office in [place].       Maternal Data    Feeding Mother's Current Feeding Choice: Breast Milk and Formula Nipple Type: Nfant Standard Flow (white)  LATCH Score                    Lactation Tools Discussed/Used Pump Education: Setup, frequency, and cleaning Reason for Pumping: twins, ETI, stimulate milk supply Pumping frequency: encouraged mom to continue to pump to stimulate milk supply  Interventions Interventions: Education  Discharge Pump: DEBP  Consult Status Consult Status: Follow-up Date: 04/25/21 Follow-up type: In-patient    [place] 04/24/2021, 2:22 PM

## 2021-04-25 ENCOUNTER — Ambulatory Visit: Payer: Self-pay

## 2021-04-25 ENCOUNTER — Other Ambulatory Visit: Payer: Self-pay | Admitting: Family Medicine

## 2021-04-25 LAB — SURGICAL PATHOLOGY

## 2021-04-25 MED ORDER — AMOXICILLIN 875 MG PO TABS: 875.0000 mg | ORAL_TABLET | Freq: Two times a day (BID) | ORAL | 0 refills | Status: AC

## 2021-04-25 NOTE — Lactation Note (Signed)
This note was copied from a baby's chart. Lactation Consultation Note  Patient Name: Bonnie Bell [id number] Date: 04/25/2021 Reason for consult: Follow-up assessment;Multiple gestation;Early term 37-38.6wks Age:34 hours    LC in to room prior to discharge 68-hour old twins. Infants have been formula fed, having good voids and stools. Mother plans to get into better pumping routine at home. Discussed normal LPTI behavior, feeding volume and expectations. Mother is aware of local support resources and [facility] services warm-line. [facility] referral on behalf of family.    Both twins are sleeping at time of visit.    Plan:   1-Feeding on demand following volume guidelines according to age 77-Hand express or use DEBP for supplementation purposes. 3-Reinforced self care (hydration, nutrition and rest)  Contact LC as needed for feeds/support/concerns/questions. All questions answered at this time. Family is ready to go.  Feeding Mother's Current Feeding Choice: Breast Milk and Formula  Lactation Tools Discussed/Used Tools: Pump Breast pump type: Double-Electric Breast Pump;Manual Reason for Pumping: multiples  Interventions Interventions: Breast feeding basics reviewed;Education;LC Services brochure;Expressed milk;Hand express;Hand pump;DEBP  Discharge Discharge Education: Engorgement and breast care;Warning signs for feeding baby Pump: Personal;Manual;DEBP  Consult Status Consult Status: Complete Date: 04/25/21 Follow-up type: Call as needed    [place] 04/25/2021, 11:24 AM

## 2021-04-26 LAB — CULTURE, OB URINE: Culture: >=100000 — AB

## 2021-04-29 ENCOUNTER — Encounter: Payer: Self-pay | Admitting: Family Medicine

## 2021-04-29 MED ORDER — CITALOPRAM HYDROBROMIDE 40 MG PO TABS: 40.0000 mg | ORAL_TABLET | Freq: Every day | ORAL | 1 refills | Status: DC

## 2021-04-30 ENCOUNTER — Other Ambulatory Visit: Payer: Self-pay

## 2021-04-30 ENCOUNTER — Encounter: Payer: Self-pay | Admitting: Family Medicine

## 2021-04-30 ENCOUNTER — Ambulatory Visit (INDEPENDENT_AMBULATORY_CARE_PROVIDER_SITE_OTHER): Payer: BC Managed Care – PPO | Admitting: Family Medicine

## 2021-04-30 VITALS — BP 127/73 | HR 91 | Wt 130.0 lb

## 2021-04-30 DIAGNOSIS — R339 Retention of urine, unspecified: Secondary | ICD-10-CM

## 2021-04-30 NOTE — Progress Notes (Signed)
° °  Subjective:    Patient ID: Bonnie Bell, female    DOB: 31-Aug-1985, 35 y.o.   MRN: [id number]  HPI Patient seen for hospital follow-up due to urinary retention.  Patient was discharged with a Foley catheter, which she has been using for the past week.  She was diagnosed with a UTI and antibiotics sent in.  She has been taking those.   Review of Systems     Objective:   Physical Exam Vitals reviewed. Exam conducted with a chaperone present.  Constitutional:      Appearance: Normal appearance.  Genitourinary:    Labia:        Right: No rash, tenderness or lesion.        Left: No rash, tenderness or lesion.   Neurological:     Mental Status: She is alert.       Assessment & Plan:   1. Urinary retention Foley catheter removed.  Patient given water.  Voiding trial done.  Patient able to void 100 mL.  Patient sent home with precautions to return if she feels like she is retaining urine.

## 2021-04-30 NOTE — Progress Notes (Signed)
Patient voided 100 cc in office after drinking one bottle of water. Patient sent home with urine hat for her to measure the rest of the day. Kathrene Alu RN

## 2021-05-07 ENCOUNTER — Encounter: Payer: Self-pay | Admitting: General Practice

## 2021-05-11 DIAGNOSIS — H1033 Unspecified acute conjunctivitis, bilateral: Secondary | ICD-10-CM | POA: Diagnosis not present

## 2021-05-29 ENCOUNTER — Ambulatory Visit: Payer: BC Managed Care – PPO | Admitting: Family Medicine

## 2021-05-29 NOTE — Progress Notes (Deleted)
? ? ?  Post Partum Visit Note ? ?Bonnie Bell is a 36 y.o. G108P3004 female who presents for a postpartum visit. She is 5 weeks postpartum following a normal spontaneous vaginal delivery.  I have fully reviewed the prenatal and intrapartum course. The delivery was at 74 gestational weeks.  Anesthesia: {anesthesia types:812}. Postpartum course has been ***. Baby is doing well***. Baby is feeding by {breastmilk/bottle:69}. Bleeding {vag bleed:12292}. Bowel function is {normal:32111}. Bladder function is {normal:32111}. Patient {is/is not:9024} sexually active. Contraception method is {contraceptive method:5051}. Postpartum depression screening: {gen negative/positive:315881}. ? ? ?The pregnancy intention screening data noted above was reviewed. Potential methods of contraception were discussed. The patient elected to proceed with No data recorded. ? ? ? ?Health Maintenance Due  ?Topic Date Due  ? COVID-19 Vaccine (1) Never done  ? Hepatitis C Screening  Never done  ? PAP SMEAR-Modifier  Never done  ? ? ?{Common ambulatory SmartLinks:19316} ? ?Review of Systems ?{ros; complete:30496} ? ?Objective:  ?LMP 08/06/2020   ? ?General:  {gen appearance:16600}  ? Breasts:  {desc; normal/abnormal/not indicated:14647}  ?Lungs: {lung exam:16931}  ?Heart:  {heart exam:5510}  ?Abdomen: {abdomen exam:16834}   ?Wound {Wound assessment:11097}  ?GU exam:  {desc; normal/abnormal/not indicated:14647}  ?     ?Assessment:  ? ? There are no diagnoses linked to this encounter. ? ?*** postpartum exam.  ? ?Plan:  ? ?Essential components of care per ACOG recommendations: ? ?1.  Mood and well being: Patient with {gen negative/positive:315881} depression screening today. Reviewed local resources for support.  ?- Patient tobacco use? {tobacco use:25506}  ?- hx of drug use? {yes/no:25505}   ? ?2. Infant care and feeding:  ?-Patient currently breastmilk feeding? {yes/no:25502}  ?-Social determinants of health (SDOH) reviewed in EPIC. No concerns***The  following needs were identified*** ? ?3. Sexuality, contraception and birth spacing ?- Patient {DOES_DOES YKD:98338} want a pregnancy in the next year.  Desired family size is {NUMBER 1-10:22536} children.  ?- Reviewed reproductive life planning. Reviewed contraceptive methods based on pt preferences and effectiveness.  Patient desired {Upstream End Methods:24109} today.   ?- Discussed birth spacing of 18 months ? ?4. Sleep and fatigue ?-Encouraged family/partner/community support of 4 hrs of uninterrupted sleep to help with mood and fatigue ? ?5. Physical Recovery  ?- Discussed patients delivery and complications. She describes her labor as {description:25511} ?- Patient had a {CHL AMB DELIVERY:[phone]}. Patient had a {laceration:25518} laceration. Perineal healing reviewed. Patient expressed understanding ?- Patient has urinary incontinence? {yes/no:25515} ?- Patient {ACTION; IS/IS SNK:[id number]} safe to resume physical and sexual activity ? ?6.  Health Maintenance ?- HM due items addressed {Yes or If no, why not?:20788} ?- Last pap smear No results found for: DIAGPAP Pap smear {done:10129} at today's visit.  ?-Breast Cancer screening indicated? {indicated:25516} ? ?7. Chronic Disease/Pregnancy Condition follow up: {Follow up:25499} ? ?- PCP follow up ? ?Kathrene Alu, RN ?[facility] [place]  ?

## 2021-06-05 ENCOUNTER — Ambulatory Visit (INDEPENDENT_AMBULATORY_CARE_PROVIDER_SITE_OTHER): Payer: BC Managed Care – PPO | Admitting: Family Medicine

## 2021-06-05 ENCOUNTER — Other Ambulatory Visit: Payer: Self-pay

## 2021-06-05 NOTE — Progress Notes (Signed)
? ? ?Post Partum Visit Note ? ?Bonnie Bell is a 36 y.o. G73P3004 female who presents for a postpartum visit. She is 6 weeks postpartum following a normal spontaneous vaginal delivery.  I have fully reviewed the prenatal and intrapartum course. The delivery was at 36 gestational weeks.  Anesthesia: epidural. Postpartum course has been normal. Baby is doing well. Baby is feeding by formula. Bleeding no bleeding. Bowel function is normal. Bladder function is normal. Patient is not sexually active. Contraception method is IUD. Postpartum depression screening: negative. ? ?Has bartholin's cyst on left, which she has had for quite awhile - since before pregnancy. Was told best to leave alone, but starting to bother her now. ? ?The pregnancy intention screening data noted above was reviewed. Potential methods of contraception were discussed. The patient elected to proceed with No data recorded. ? ? Edinburgh Postnatal Depression Scale - 06/05/21 0927   ? ?  ? Edinburgh Postnatal Depression Scale:  In the Past 7 Days  ? I have been able to laugh and see the funny side of things. 0   ? I have looked forward with enjoyment to things. 0   ? I have blamed myself unnecessarily when things went wrong. 0   ? I have been anxious or worried for no good reason. 0   ? I have felt scared or panicky for no good reason. 0   ? Things have been getting on top of me. 0   ? I have been so unhappy that I have had difficulty sleeping. 0   ? I have felt sad or miserable. 0   ? I have been so unhappy that I have been crying. 0   ? The thought of harming myself has occurred to me. 0   ? Edinburgh Postnatal Depression Scale Total 0   ? ?  ?  ? ?  ? ? ?Health Maintenance Due  ?Topic Date Due  ? COVID-19 Vaccine (1) Never done  ? Hepatitis C Screening  Never done  ? PAP SMEAR-Modifier  Never done  ? ? ?The following portions of the patient's history were reviewed and updated as appropriate: allergies, current medications, past family history, past  medical history, past social history, past surgical history, and problem list. ? ?Review of Systems ?Pertinent items are noted in HPI. ? ?Objective:  ?BP (!) 102/59   Pulse 89   Wt 129 lb (58.5 kg)   LMP 08/06/2020   Breastfeeding No   BMI 27.92 kg/m?   ? ?General:  alert, cooperative, and no distress  ? Breasts:  not indicated  ?Lungs: clear to auscultation bilaterally  ?Heart:  regular rate and rhythm, S1, S2 normal, no murmur, click, rub or gallop  ?Abdomen: soft, non-tender; bowel sounds normal; no masses,  no organomegaly   ?Wound N/a  ?GU exam:  not indicated  ?     ?Assessment:  ? ?1. Postpartum care and examination   ? ? ? ?Plan:  ? ?Essential components of care per ACOG recommendations: ? ?1.  Mood and well being: Patient with negative depression screening today. Reviewed local resources for support.  ?- Patient tobacco use? No.   ?- hx of drug use? No.   ? ?2. Infant care and feeding:  ?-Patient currently breastmilk feeding? No.  ?-Social determinants of health (SDOH) reviewed in EPIC. No concerns ? ?3. Sexuality, contraception and birth spacing ?- Patient does not want a pregnancy in the next year.   ?- Reviewed reproductive life planning. Reviewed contraceptive  methods based on pt preferences and effectiveness.  Patient desired IUD or IUS today.   ?- Discussed birth spacing of 18 months ? ?4. Sleep and fatigue ?-Encouraged family/partner/community support of 4 hrs of uninterrupted sleep to help with mood and fatigue ? ?5. Physical Recovery  ?- Discussed patients delivery and complications. She describes her labor as good. ?- Patient had a Vaginal, no problems at delivery. Patient had a 1st degree laceration. Perineal healing reviewed. Patient expressed understanding ?- Patient has urinary incontinence? No. ?- Patient is safe to resume physical and sexual activity ? ?6.  Health Maintenance ?- HM due items addressed  ?- Last pap smear No results found for: DIAGPAP Pap smear not done at today's visit.   ?-Breast Cancer screening indicated? No.  ? ?7. Chronic Disease/Pregnancy Condition follow up: None ? ?- return for IUD and bartholin cyst drainage. ? ?Truett Mainland, DO ?[facility]  ?

## 2021-06-12 ENCOUNTER — Ambulatory Visit: Payer: BC Managed Care – PPO | Admitting: Family Medicine

## 2021-06-19 ENCOUNTER — Ambulatory Visit: Payer: BC Managed Care – PPO | Admitting: Family Medicine

## 2021-07-10 ENCOUNTER — Ambulatory Visit (INDEPENDENT_AMBULATORY_CARE_PROVIDER_SITE_OTHER): Payer: BC Managed Care – PPO | Admitting: Family Medicine

## 2021-07-10 ENCOUNTER — Encounter: Payer: Self-pay | Admitting: Family Medicine

## 2021-07-10 VITALS — BP 117/64 | HR 89 | Wt 135.0 lb

## 2021-07-10 DIAGNOSIS — Z30430 Encounter for insertion of intrauterine contraceptive device: Secondary | ICD-10-CM

## 2021-07-10 DIAGNOSIS — Z01812 Encounter for preprocedural laboratory examination: Secondary | ICD-10-CM

## 2021-07-10 LAB — POCT URINE PREGNANCY: Preg Test, Ur: NEGATIVE

## 2021-07-10 MED ORDER — LEVONORGESTREL 20.1 MCG/DAY IU IUD
1.0000 | INTRAUTERINE_SYSTEM | Freq: Once | INTRAUTERINE | Status: AC
Start: 2021-07-10 — End: 2021-07-10
  Administered 2021-07-10: 1 via INTRAUTERINE

## 2021-07-10 NOTE — Progress Notes (Signed)
IUD Procedure Note ?Patient identified, informed consent performed, signed copy in chart, time out was performed.  Urine pregnancy test negative. ? ?Speculum placed in the vagina.  Cervix visualized.  Cleaned with Betadine x 2.  Paracervical block placed with Lidocaine 2% with epinephrine 19m spread between the 6 o'clock positions. Cervix grasped anteriorly with a single tooth tenaculum.  Uterus sounded to 9 cm.  Liletta  IUD placed per manufacturer's recommendations.  Strings trimmed to 3 cm. Tenaculum was removed, good hemostasis noted.  Patient tolerated procedure well.  ? ?Patient given post procedure instructions and Liletta care card with expiration date.  Patient is asked to check IUD strings periodically and follow up in 4-6 weeks for IUD check. ? ? ?She was also scheduled for Bartholin Cyst removal. However, on examination the large bulge was noted to be further back than a normal bartholin cyst and appears to be located more in the vaginal wall. Will have my partner assess this at her string check in 3-4 weeks. ?

## 2021-08-06 NOTE — Progress Notes (Signed)
GYNECOLOGY OFFICE VISIT NOTE  History:   Bonnie Bell is a 36 y.o. [phone] here today for follow up for a vaginal bulge/cyst. It was originally thought to be a Bartholin's cyst but was felt to be more in the vaginal wall by Dr. Nehemiah Settle.  She feels like it has grown and is painful at rest. She has been unable to have intercourse due to it.   She also had an IUD placed on 5/27.   She delivered twins vaginally in February of 2023.   She denies any abnormal vaginal discharge, bleeding, pelvic pain or other concerns.     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   Health Maintenance:   No results found for: DIAGPAP and none in CE   Review of Systems:  Pertinent items noted in HPI and remainder of comprehensive ROS otherwise negative.  Physical Exam:  BP 116/71   Pulse 76   Wt 137 lb (62.1 kg)   LMP 07/17/2021 Comment: pt still having bleeding  BMI 29.65 kg/m  CONSTITUTIONAL: Well-developed, well-nourished female in no acute distress.  HEENT:  Normocephalic, atraumatic. External right and left ear normal. No scleral icterus.  NECK: Normal range of motion, supple, no masses noted on observation SKIN: No rash noted. Not diaphoretic. No erythema. No pallor. MUSCULOSKELETAL: Normal range of motion. No edema noted. NEUROLOGIC: Alert and oriented to person, place, and time. Normal muscle tone coordination. No cranial nerve deficit noted. PSYCHIATRIC: Normal mood and affect. Normal behavior. Normal judgment and thought content.  PELVIC:  4 cm cyst off the lateral vaginal wall around 2 oclock feeling distinctly  like a vaginal cyst that is protruding off the vaginal wall (almost like it is pedunculated). It was white/clear through the vaginal mucosa and felt like it was under significant tension on bimanual , IUD strings visualized.   Labs and Imaging No results found for this or any previous visit (from the past 168 hour(s)). No results found.   I&D NOTE The indications for I&D  were reviewed - vaginal cyst (4cm).   Risks include pain, bleeding, infection, scarring and need for additional procedures were discussed. The patient stated understanding and agreed to undergo procedure today. Consent was signed, time out performed.   The patient's vagina was numbed with 1 cc of 1% lidocaine. A stab incision was made with an 11 blade scalpel.  Mucousy-clear fluid was drained. Minimal bleeding from stab incision The patient tolerated the procedure well.   Post-procedure instructions and ABX were given to the patient. The patient is to call with heavy bleeding, fever greater than 100.4, foul smelling vaginal discharge or other concerns.   Assessment and Plan:   1. Vaginal cyst - Discussed risks and benefits of I&D. Could be something structurally significant. Could recur. If it recurs, would do excision in the OR.  - She would like to proceed with I&D so it was done today. No complications.   2. Breakthrough bleeding with IUD -     tranexamic acid (LYSTEDA) 650 MG TABS tablet; Take 2 tablets (1,300 mg total) by mouth 3 (  three) times daily. Take during menses for a maximum of five days     Routine preventative health maintenance measures emphasized. Please refer to After Visit Summary for other counseling recommendations.   No follow-ups on file.  Radene Gunning, MD, [facility], [place]

## 2021-08-13 ENCOUNTER — Ambulatory Visit (INDEPENDENT_AMBULATORY_CARE_PROVIDER_SITE_OTHER): Payer: BC Managed Care – PPO | Admitting: Obstetrics and Gynecology

## 2021-08-13 ENCOUNTER — Encounter: Payer: Self-pay | Admitting: Obstetrics and Gynecology

## 2021-08-13 VITALS — BP 116/71 | HR 76 | Wt 137.0 lb

## 2021-08-13 DIAGNOSIS — Z975 Presence of (intrauterine) contraceptive device: Secondary | ICD-10-CM | POA: Diagnosis not present

## 2021-08-13 DIAGNOSIS — N921 Excessive and frequent menstruation with irregular cycle: Secondary | ICD-10-CM

## 2021-08-13 DIAGNOSIS — N898 Other specified noninflammatory disorders of vagina: Secondary | ICD-10-CM

## 2021-08-13 DIAGNOSIS — Z124 Encounter for screening for malignant neoplasm of cervix: Secondary | ICD-10-CM

## 2021-08-13 MED ORDER — TRANEXAMIC ACID 650 MG PO TABS: 1300.0000 mg | ORAL_TABLET | Freq: Three times a day (TID) | ORAL | 2 refills | Status: DC

## 2021-08-25 ENCOUNTER — Telehealth: Payer: Self-pay | Admitting: Nurse Practitioner

## 2021-08-25 NOTE — Telephone Encounter (Signed)
LEFT VOICEMAIL GIVING DETAILS ABOUT NEW PATIENT APPT. I ALSO SENT MYCHART INFO THRU

## 2021-08-26 ENCOUNTER — Ambulatory Visit: Payer: BC Managed Care – PPO | Admitting: Nurse Practitioner

## 2021-08-26 ENCOUNTER — Encounter: Payer: Self-pay | Admitting: Nurse Practitioner

## 2021-08-26 VITALS — BP 112/70 | HR 74 | Temp 97.4°F | Ht <= 58 in | Wt 139.2 lb

## 2021-08-26 DIAGNOSIS — N921 Excessive and frequent menstruation with irregular cycle: Secondary | ICD-10-CM | POA: Diagnosis not present

## 2021-08-26 DIAGNOSIS — N939 Abnormal uterine and vaginal bleeding, unspecified: Secondary | ICD-10-CM

## 2021-08-26 DIAGNOSIS — R5383 Other fatigue: Secondary | ICD-10-CM

## 2021-08-26 DIAGNOSIS — Z2821 Immunization not carried out because of patient refusal: Secondary | ICD-10-CM

## 2021-08-26 MED ORDER — NORETHINDRONE ACETATE 5 MG PO TABS: 10.0000 mg | ORAL_TABLET | Freq: Every day | ORAL | 0 refills | Status: DC

## 2021-08-26 NOTE — Patient Instructions (Addendum)
Continue medications We will call you with lab results Take Aygesin 10 mg daily for 10 days for irregular uterine bleeding Try Rogaine shampoo and Biotin over-the-counter for hair loss Follow-up in 14-month, pending labs  Menorrhagia Menorrhagia is when your monthly periods are heavy or last longer than normal. If you have this condition, bleeding and cramping may make it hard for you to do your daily activities. What are the causes? Common causes of this condition include: Growths in the womb (uterus). These are polyps or fibroids. These growths are not cancer. Problems with two hormones called estrogen and progesterone. One of the ovaries not releasing an egg during one or more months. A problem with the thyroid gland. Having a device for birth control (IUD). Side effects of some medicines, such as NSAIDs or blood thinners. A disorder that stops the blood from clotting normally. What increases the risk? You are more likely to have this condition if you have cancer of the womb. What are the signs or symptoms? Having to change your pad or tampon every 1-2 hours because it is soaked. Needing to use pads and tampons at the same time because of heavy bleeding. Needing to wake up to change your pads or tampons during the night. Passing blood clots larger than 1 inch (2.5 cm) in size. Having bleeding that lasts for more than 7 days. Having symptoms of low iron levels (anemia), such as feeling tired or having shortness of breath. How is this treated? You may not need to be treated for this condition. But if you need treatment, you may be given medicines: To reduce bleeding during your period. These include birth control medicines. To make your blood thick. This slows bleeding. To reduce swelling. Medicines that do this include ibuprofen. That have a hormone called progestin. That make the ovaries stop working for a short time. To treat low iron levels. You will be given iron pills if you  have this condition. If medicines do not work, surgery may be done. Surgery may be done to: Remove a part of the lining of the womb. This lining is called the endometrium. This reduces bleeding during a period. Remove growths in the womb. These may be polyps or fibroids. Remove the entire lining of the womb. Remove the womb entirely. This procedure is called a hysterectomy. Follow these instructions at home: Medicines Take over-the-counter and prescription medicines only as told by your doctor. This includes iron pills. Do not change or switch medicines without asking your doctor. Do not take aspirin or medicines that contain aspirin 1 week before or during your period. Aspirin may make bleeding worse. Managing constipation Iron pills may cause trouble pooping (constipation). To prevent or treat problems when pooping, you may need to: Drink enough fluid to keep your pee (urine) pale yellow. Take over-the-counter or prescription medicines. Eat foods that are high in fiber. These include beans, whole grains, and fresh fruits and vegetables. Limit foods that are high in fat and sugar. These include fried or sweet foods. General instructions If you need to change your pad or tampon more than once every 2 hours, limit your activity until the bleeding stops. Eat healthy meals and foods that are high in iron. Foods that have a lot of iron include: Leafy green vegetables. Meat. Liver. Eggs. Whole-grain breads and cereals. Do not try to lose weight until your heavy bleeding has stopped and you have normal amounts of iron in your blood. If you need to lose weight, work with your  doctor. Keep all follow-up visits. Contact a doctor if: You soak through a pad or tampon every 1 or 2 hours, and this happens every time you have a period. You need to use pads and tampons at the same time because you are bleeding so much. You are taking medicine, and: You feel like you may vomit. You vomit. You have  watery poop (diarrhea). You have other problems that may be related to the medicine you are taking. Get help right away if: You soak through more than a pad or tampon in 1 hour. You pass clots bigger than 1 inch (2.5 cm) wide. You feel short of breath. You feel like your heart is beating too fast. You feel dizzy or you faint. You feel very weak or tired. Summary Menorrhagia is when your menstrual periods are heavy or last longer than normal. You may not need to be treated for this condition. If you need treatment, you may be given medicines or have surgery. Take over-the-counter and prescription medicines only as told by your doctor. This includes iron pills. Get help right away if you soak through more than a pad or tampon in 1 hour or you pass large clots. Also, get help right away if you feel dizzy, short of breath, or very weak or tired. This information is not intended to replace advice given to you by your health care provider. Make sure you discuss any questions you have with your health care provider. Document Revised: 11/14/2019 Document Reviewed: 11/14/2019 Elsevier Patient Education  [place].

## 2021-08-26 NOTE — Progress Notes (Signed)
New Patient Office Visit  Subjective    Patient ID: Bonnie Bell, female    DOB: 22-Dec-1985  Age: 36 y.o. MRN: [id number]  CC:  Chief Complaint  Patient presents with   Establish Care    HPI Bonnie Bell presents to establish care. 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 Mother-in-law helps care for children. She is prescribed Citalopram 40 mg by ob/gyn for GAD. States symptoms are well-controlled. She is prescribed Maxalt PRN for chronic migraines. She was previously prescribed Imitrex, Ubrelvy, and Nurtec.   Bonnie Bell tells me she is experiencing irregular uterine bleeding and hair loss for 6 weeks. Was prescribed Liletta per ob/gyn to control bleeding, symptoms did not subside. States she is concerned she has become anemic.    GAD-7 Results    08/26/2021   11:16 AM 02/14/2021   10:50 AM  GAD-7 Generalized Anxiety Disorder Screening Tool  1. Feeling Nervous, Anxious, or on Edge 0 0  2. Not Being Able to Stop or Control Worrying 1 1  3. Worrying Too Much About Different Things 1 0  4. Trouble Relaxing 0 0  5. Being So Restless it's Hard To Sit Still 0 0  6. Becoming Easily Annoyed or Irritable 0 0  7. Feeling Afraid As If Something Awful Might Happen 0 0  Total GAD-7 Score 2 1  Difficulty At Work, Home, or Getting  Along With Others? Not difficult at all Not difficult at all    PHQ-9 Scores    02/14/2021   10:48 AM  PHQ9 SCORE ONLY  PHQ-9 Total Score 2     Outpatient Encounter Medications as of 08/26/2021  Medication Sig   citalopram (CELEXA) 40 MG tablet Take 1 tablet (40 mg total) by mouth daily.   Prenatal Vit-Fe Fumarate-FA (PRENATAL VITAMINS PO) Take 1 tablet by mouth daily.   [DISCONTINUED] acetaminophen (TYLENOL) 500 MG tablet Take 2 tablets (1,000 mg total) by mouth every 8 (eight) hours as needed (pain). (Patient not taking: Reported on 06/05/2021)   [DISCONTINUED] ferrous sulfate 325 (65 FE)  MG tablet Take 325 mg by mouth daily with breakfast. (Patient not taking: Reported on 06/05/2021)   [DISCONTINUED] ibuprofen (ADVIL) 600 MG tablet Take 1 tablet (600 mg total) by mouth every 6 (six) hours as needed (pain). (Patient not taking: Reported on 06/05/2021)   [DISCONTINUED] tranexamic acid (LYSTEDA) 650 MG TABS tablet Take 2 tablets (1,300 mg total) by mouth 3 (three) times daily. Take during menses for a maximum of five days   No facility-administered encounter medications on file as of 08/26/2021.    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   Headache     Past Surgical History:  Procedure Laterality Date   surgery on right foot     Cancer    Family History  Problem Relation Age of Onset   Lung cancer Mother    Kidney disease Mother     Social History   Socioeconomic History   Marital status: Married    Spouse name: Kristel Durkee   Number of children: 4   Years of education: Not on file   Highest education level: Not on file  Occupational History   Not on file  Tobacco Use   Smoking status: Never   Smokeless tobacco: Never  Vaping Use   Vaping Use: Never used  Substance and Sexual Activity  Alcohol use: Never   Drug use: Never   Sexual activity: Yes    Partners: Male  Other Topics Concern   Not on file  Social History Narrative   Not on file   Social Determinants of Health   Financial Resource Strain: Low Risk  (08/26/2021)   Overall Financial Resource Strain (CARDIA)    Difficulty of Paying Living Expenses: Not hard at all  Food Insecurity: No Food Insecurity (08/26/2021)   Hunger Vital Sign    Worried About Running Out of Food in the Last Year: Never true    Ran Out of Food in the Last Year: Never true  Transportation Needs: No Transportation Needs (08/26/2021)   PRAPARE - Hydrologist (Medical): No    Lack of Transportation (Non-Medical): No  Physical Activity:  Insufficiently Active (08/26/2021)   Exercise Vital Sign    Days of Exercise per Week: 5 days    Minutes of Exercise per Session: 20 min  Stress: No Stress Concern Present (08/26/2021)   [place]    Feeling of Stress : Not at all  Social Connections: Moderately Integrated (08/26/2021)   Social Connection and Isolation Panel [NHANES]    Frequency of Communication with Friends and Family: More than three times a week    Frequency of Social Gatherings with Friends and Family: More than three times a week    Attends Religious Services: More than 4 times per year    Active Member of [employer] or Organizations: No    Attends Archivist Meetings: Never    Marital Status: Married  Human resources officer Violence: Not At Risk (08/26/2021)   Humiliation, Afraid, Rape, and Kick questionnaire    Fear of Current or Ex-Partner: No    Emotionally Abused: No    Physically Abused: No    Sexually Abused: No    Review of Systems  Constitutional:  Positive for malaise/fatigue. Negative for chills and fever.  HENT:  Negative for ear pain, sinus pain and sore throat.   Respiratory:  Negative for cough and shortness of breath.   Cardiovascular:  Negative for chest pain.  Genitourinary:        Irregular menstrual cycle  Musculoskeletal:  Negative for myalgias.  Neurological:  Negative for headaches.  All other systems reviewed and are negative.       Objective    BP 112/70   Pulse 74   Temp (!) 97.4 F (36.3 C)   Ht '4\' 10"'$  (1.473 m)   Wt 139 lb 3.2 oz (63.1 kg)   LMP 07/17/2021 Comment: pt still having bleeding  SpO2 98%   Breastfeeding No   BMI 29.09 kg/m   Physical Exam Vitals reviewed.  Constitutional:      Appearance: Normal appearance.  HENT:     Right Ear: Tympanic membrane normal.     Left Ear: Tympanic membrane normal.     Mouth/Throat:     Pharynx: Posterior oropharyngeal erythema present.  Eyes:      Comments: Eye glasses in place  Cardiovascular:     Rate and Rhythm: Normal rate and regular rhythm.     Pulses: Normal pulses.     Heart sounds: Normal heart sounds.  Pulmonary:     Effort: Pulmonary effort is normal.     Breath sounds: Normal breath sounds.  Abdominal:     General: Bowel sounds are normal.     Palpations: Abdomen is soft.  Skin:  General: Skin is warm and dry.     Capillary Refill: Capillary refill takes less than 2 seconds.  Neurological:     General: No focal deficit present.     Mental Status: She is alert and oriented to person, place, and time.  Psychiatric:        Mood and Affect: Mood normal.        Behavior: Behavior normal.     Assessment & Plan:   1. Menorrhagia with irregular cycle - CBC with Differential/Platelet - Comprehensive metabolic panel - TSH - norethindrone (AYGESTIN) 5 MG tablet; Take 2 tablets (10 mg total) by mouth daily for 10 days.  Dispense: 20 tablet; Refill: 0  2. Abnormal uterine bleeding - norethindrone (AYGESTIN) 5 MG tablet; Take 2 tablets (10 mg total) by mouth daily for 10 days.  Dispense: 20 tablet; Refill: 0  3. Vaccination refused by patient -declines COVID-19 vaccine  4. Fatigue, unspecified type - VITAMIN D 25 Hydroxy (Vit-D Deficiency, Fractures)    Continue medications We will call you with lab results Take Aygesin 10 mg daily for 10 days for irregular uterine bleeding Try Rogaine shampoo and Biotin over-the-counter for hair loss Follow-up in 63-month, pending labs   Follow-up: 641-month I, ShRip HarbourNP, have reviewed all documentation for this visit. The documentation on 08/26/21 for the exam, diagnosis, procedures, and orders are all accurate and complete.   Signed, Jerrell BelfastDNP 08/26/21 at 1:34 pm

## 2021-08-27 LAB — CBC WITH DIFFERENTIAL/PLATELET
Basophils Absolute: 0.1 10*3/uL (ref 0.0–0.2)
Basos: 1 %
EOS (ABSOLUTE): 0.1 10*3/uL (ref 0.0–0.4)
Eos: 2 %
Hematocrit: 39.6 % (ref 34.0–46.6)
Hemoglobin: 12.9 g/dL (ref 11.1–15.9)
Immature Grans (Abs): 0 10*3/uL (ref 0.0–0.1)
Immature Granulocytes: 0 %
Lymphocytes Absolute: 1.7 10*3/uL (ref 0.7–3.1)
Lymphs: 34 %
MCH: 28.4 pg (ref 26.6–33.0)
MCHC: 32.6 g/dL (ref 31.5–35.7)
MCV: 87 fL (ref 79–97)
Monocytes Absolute: 0.3 10*3/uL (ref 0.1–0.9)
Monocytes: 7 %
Neutrophils Absolute: 2.8 10*3/uL (ref 1.4–7.0)
Neutrophils: 56 %
Platelets: 246 10*3/uL (ref 150–450)
RBC: 4.54 x10E6/uL (ref 3.77–5.28)
RDW: 12.4 % (ref 11.7–15.4)
WBC: 4.9 10*3/uL (ref 3.4–10.8)

## 2021-08-27 LAB — COMPREHENSIVE METABOLIC PANEL
ALT: 10 IU/L (ref 0–32)
AST: 18 IU/L (ref 0–40)
Albumin/Globulin Ratio: 2 (ref 1.2–2.2)
Albumin: 4.5 g/dL (ref 3.8–4.8)
Alkaline Phosphatase: 68 IU/L (ref 44–121)
BUN/Creatinine Ratio: 16 (ref 9–23)
BUN: 11 mg/dL (ref 6–20)
Bilirubin Total: 0.2 mg/dL (ref 0.0–1.2)
CO2: 25 mmol/L (ref 20–29)
Calcium: 9.5 mg/dL (ref 8.7–10.2)
Chloride: 104 mmol/L (ref 96–106)
Creatinine, Ser: 0.68 mg/dL (ref 0.57–1.00)
Globulin, Total: 2.2 g/dL (ref 1.5–4.5)
Glucose: 87 mg/dL (ref 70–99)
Potassium: 5.1 mmol/L (ref 3.5–5.2)
Sodium: 139 mmol/L (ref 134–144)
Total Protein: 6.7 g/dL (ref 6.0–8.5)
eGFR: 118 mL/min/{1.73_m2} (ref >59)

## 2021-08-27 LAB — VITAMIN D 25 HYDROXY (VIT D DEFICIENCY, FRACTURES): Vit D, 25-Hydroxy: 33.5 ng/mL (ref 30.0–100.0)

## 2021-08-27 LAB — TSH: TSH: 0.901 u[IU]/mL (ref 0.450–4.500)

## 2021-09-22 NOTE — Progress Notes (Signed)
GYNECOLOGY OFFICE VISIT NOTE  History:   Bonnie Bell is a 36 y.o. [phone] here today for vaginal cyst which has been I&D once on 5/31 but then it came back. We had planned if it came back we would do a cyst removal in the OR.   BTB continues despite aygestin.    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   Health Maintenance:   NO pap smear on file - needs pap smear  Review of Systems:  Pertinent items noted in HPI and remainder of comprehensive ROS otherwise negative.  Physical Exam:  BP 118/65   Pulse 78   Wt 142 lb (64.4 kg)   Breastfeeding No   BMI 29.68 kg/m  CONSTITUTIONAL: Well-developed, well-nourished female in no acute distress.  HEENT:  Normocephalic, atraumatic. External right and left ear normal. No scleral icterus.  NECK: Normal range of motion, supple, no masses noted on observation SKIN: No rash noted. Not diaphoretic. No erythema. No pallor. MUSCULOSKELETAL: Normal range of motion. No edema noted. NEUROLOGIC: Alert and oriented to person, place, and time. Normal muscle tone coordination. No cranial nerve deficit noted. PSYCHIATRIC: Normal mood and affect. Normal behavior. Normal judgment and thought content.  CARDIOVASCULAR: Normal heart rate noted RESPIRATORY: Effort and breath sounds normal, no problems with respiration noted ABDOMEN: No masses noted. No other overt distention noted.    PELVIC:  4 cm cyst off the lateral vaginal wall around 2 oclock feeling distinctly like a vaginal cyst that is protruding off the vaginal wall   I&D NOTE The indications for I&D  were reviewed -  vaginal cyst (4cm). She would like to try once more in the office and if unsuccessful we will do marsupialization in the OR.   Risks include pain, bleeding, infection, scarring and need for additional procedures were discussed. The patient stated understanding and agreed to undergo procedure today. Consent was signed, time out performed.    The patient's vagina was numbed with 1 cc of 1% lidocaine. A stab incision was made with an 11 blade scalpel. And then extended with scalpel and long kelly to about 1 cm. It was then made into a cruciate shaped incision with small scissors.   Mucousy-clear fluid was drained. Minimal bleeding from stab incision The patient tolerated the procedure well.    Post-procedure instructions and ABX were given to the patient. The patient is to call with heavy bleeding, fever greater than 100.4, foul smelling vaginal discharge or other concerns.   Assessment and Plan:   1. Vaginal cyst - If this I&D doesn't work we discussed marsupialization in the [place]. She would like to do this. We will schedule now and she can cancel if this works.   2. BTB with IUD - Discussed would give it time if she can tolerate. She has tried aygestin and lysteda without success. She is not done with childbearing at this time.    Routine preventative health maintenance measures emphasized. Needs annual exam for pap smear as well.  Please refer to After Visit Summary for other counseling recommendations.   No follow-ups  on file.  Radene Gunning, MD, [place] for [facility], [place]

## 2021-09-26 ENCOUNTER — Encounter: Payer: Self-pay | Admitting: Obstetrics and Gynecology

## 2021-09-26 ENCOUNTER — Ambulatory Visit (INDEPENDENT_AMBULATORY_CARE_PROVIDER_SITE_OTHER): Payer: BC Managed Care – PPO | Admitting: Obstetrics and Gynecology

## 2021-09-26 ENCOUNTER — Encounter: Payer: Self-pay | Admitting: General Practice

## 2021-09-26 VITALS — BP 118/65 | HR 78 | Wt 142.0 lb

## 2021-09-26 DIAGNOSIS — Z975 Presence of (intrauterine) contraceptive device: Secondary | ICD-10-CM

## 2021-09-26 DIAGNOSIS — N921 Excessive and frequent menstruation with irregular cycle: Secondary | ICD-10-CM | POA: Diagnosis not present

## 2021-09-26 DIAGNOSIS — N898 Other specified noninflammatory disorders of vagina: Secondary | ICD-10-CM

## 2021-10-30 ENCOUNTER — Ambulatory Visit: Payer: Self-pay | Admitting: Family Medicine

## 2021-11-13 ENCOUNTER — Encounter: Payer: Self-pay | Admitting: Nurse Practitioner

## 2021-11-24 NOTE — Progress Notes (Signed)
Message sent to Dr, Damita Dunnings and George Hugh epic ib, left patient message x 4 days patient has not returned call for pre op history and instructions for 12-03-2021 surgery

## 2021-11-26 ENCOUNTER — Encounter: Payer: Self-pay | Admitting: Obstetrics and Gynecology

## 2021-12-03 ENCOUNTER — Ambulatory Visit (HOSPITAL_BASED_OUTPATIENT_CLINIC_OR_DEPARTMENT_OTHER): Admit: 2021-12-03 | Payer: BC Managed Care – PPO | Admitting: Obstetrics and Gynecology

## 2021-12-03 DIAGNOSIS — N898 Other specified noninflammatory disorders of vagina: Secondary | ICD-10-CM

## 2021-12-03 SURGERY — EXCISION, CYST, VAGINA
Anesthesia: Choice

## 2021-12-11 ENCOUNTER — Ambulatory Visit: Payer: Self-pay | Admitting: Family Medicine

## 2021-12-24 ENCOUNTER — Encounter: Payer: Self-pay | Admitting: Nurse Practitioner

## 2021-12-24 ENCOUNTER — Ambulatory Visit: Payer: BC Managed Care – PPO | Admitting: Nurse Practitioner

## 2021-12-24 VITALS — BP 122/60 | HR 101 | Temp 97.3°F | Ht 63.0 in | Wt 143.0 lb

## 2021-12-24 DIAGNOSIS — F4321 Adjustment disorder with depressed mood: Secondary | ICD-10-CM

## 2021-12-24 DIAGNOSIS — F411 Generalized anxiety disorder: Secondary | ICD-10-CM | POA: Diagnosis not present

## 2021-12-24 DIAGNOSIS — F331 Major depressive disorder, recurrent, moderate: Secondary | ICD-10-CM | POA: Diagnosis not present

## 2021-12-24 DIAGNOSIS — Z634 Disappearance and death of family member: Secondary | ICD-10-CM | POA: Diagnosis not present

## 2021-12-24 MED ORDER — LORAZEPAM 0.5 MG PO TABS: 0.5000 mg | ORAL_TABLET | Freq: Two times a day (BID) | ORAL | 1 refills | Status: DC | PRN

## 2021-12-24 MED ORDER — CARIPRAZINE HCL 1.5 MG PO CAPS: 1.5000 mg | ORAL_CAPSULE | Freq: Every day | ORAL | 2 refills | Status: DC

## 2021-12-24 MED ORDER — CARIPRAZINE HCL 1.5 MG PO CAPS: 1.5000 mg | ORAL_CAPSULE | Freq: Every day | ORAL | 0 refills | Status: DC

## 2021-12-24 NOTE — Progress Notes (Signed)
Acute Office Visit  Subjective:    Patient ID: Bonnie Bell, female    DOB: 03/05/1986, 36 y.o.   MRN: [id number]  Chief Complaint  Patient presents with   Anxiety   Depression    HPI: Pt presents for evaluation of depression with anxiety. She tells me she had 59-monthold twin boys in addition to a 34year old and 457year-old sons. Unfortunately, she lost one of her twins unexpectedly on her birthday 3-weeks ago. States [facility] The baby was placed on life support but did not recover from his illness. She made the difficult decision to withdraw life support on 12/10/21. She tells me SAdministratoropened a death investigation per protocol. Reports autopsy ruled out abuse or foul play. However, an "intestinal issue" is being further investigated by the medical examiners. She is concerned for the health of her remaining twin son and other anxious worrying thoughts. She is not sleeping. She has not returned back to work. She is scheduled to see a grief counselor tomorrow. She is currently prescribed Citalopram 40 mg for depression with anxiety.   Patient is in today for Depression:  Current treatment includes Celexa.   She reports excellent compliance with treatment. She is not having side effects.   She reports good tolerance of treatment. Current symptoms include: difficulty concentrating, fatigue, feelings of worthlessness/guilt, and insomnia She feels she is Worse since last visit.     12/24/2021    2:54 PM 02/14/2021   10:48 AM  Depression screen PHQ 2/9  Decreased Interest 2 0  Down, Depressed, Hopeless 2 0  PHQ - 2 Score 4 0  Altered sleeping 3 1  Tired, decreased energy 2 1  Change in appetite 2 0  Feeling bad or failure about yourself  0 0  Trouble concentrating 1 0  Moving slowly or fidgety/restless 1 0  Suicidal thoughts 0  0  PHQ-9 Score 13 2  Difficult doing work/chores Not difficult at all Not difficult at all    Anxiety:  Current treatment includes Celexa.  She reports excellent compliance with treatment. She reports excellent tolerance of treatment. She is not having side effects.  She feels her anxiety is severe and Worse since last visit.   GAD-7 Results    12/24/2021    2:55 PM 08/26/2021   11:16 AM 02/14/2021   10:50 AM  GAD-7 Generalized Anxiety Disorder Screening Tool  1. Feeling Nervous, Anxious, or on Edge 2 0 0  2. Not Being Able to Stop or Control Worrying '2 1 1  ' 3. Worrying Too Much About Different Things 2 1 0  4. Trouble Relaxing 2 0 0  5. Being So Restless it's Hard To Sit Still 1 0 0  6. Becoming Easily Annoyed or Irritable 1 0 0  7. Feeling Afraid As If Something Awful Might Happen 2 0 0  Total GAD-7 Score '12 2 1  ' Difficulty At Work, Home, or Getting  Along With Others? Somewhat difficult Not difficult at all Not difficult at all    PHQ-9 Scores    12/24/2021    2:54 PM 02/14/2021   10:48 AM  PHQ9 SCORE ONLY  PHQ-9 Total Score 13 2     Past Medical History:  Diagnosis Date   Anxiety    [place]cer ([place]    sarcoma on foot   Headache     Past Surgical History:  Procedure  Laterality Date   surgery on right foot     Cancer    Family History  Problem Relation Age of Onset   Lung cancer Mother    Kidney disease Mother     Social History   Socioeconomic History   Marital status: Married    Spouse name: Swati Granberry   Number of children: 4   Years of education: Not on file   Highest education level: Not on file  Occupational History   Not on file  Tobacco Use   Smoking status: Never   Smokeless tobacco: Never  Vaping Use   Vaping Use: Never used  Substance and Sexual Activity   Alcohol use: Never   Drug use: Never   Sexual activity: Yes    Partners: Male  Other Topics Concern   Not on file  Social History Narrative   Not on file   Social  Determinants of Health   Financial Resource Strain: Low Risk  (08/26/2021)   Overall Financial Resource Strain (CARDIA)    Difficulty of Paying Living Expenses: Not hard at all  Food Insecurity: No Food Insecurity (08/26/2021)   Hunger Vital Sign    Worried About Running Out of Food in the Last Year: Never true    Ran Out of Food in the Last Year: Never true  Transportation Needs: No Transportation Needs (08/26/2021)   PRAPARE - Hydrologist (Medical): No    Lack of Transportation (Non-Medical): No  Physical Activity: Insufficiently Active (08/26/2021)   Exercise Vital Sign    Days of Exercise per Week: 5 days    Minutes of Exercise per Session: 20 min  Stress: No Stress Concern Present (08/26/2021)   [place]    Feeling of Stress : Not at all  Social Connections: Moderately Integrated (08/26/2021)   Social Connection and Isolation Panel [NHANES]    Frequency of Communication with Friends and Family: More than three times a week    Frequency of Social Gatherings with Friends and Family: More than three times a week    Attends Religious Services: More than 4 times per year    Active Member of [employer] or Organizations: No    Attends Archivist Meetings: Never    Marital Status: Married  Human resources officer Violence: Not At Risk (08/26/2021)   Humiliation, Afraid, Rape, and Kick questionnaire    Fear of Current or Ex-Partner: No    Emotionally Abused: No    Physically Abused: No    Sexually Abused: No    Outpatient Medications Prior to Visit  Medication Sig Dispense Refill   levonorgestrel (MIRENA) 20 MCG/DAY IUD 1 each by Intrauterine route once.     citalopram (CELEXA) 40 MG tablet Take 1 tablet (40 mg total) by mouth daily. 90 tablet 1   norethindrone (AYGESTIN) 5 MG tablet Take 2 tablets (10 mg total) by mouth daily for 10 days. 20 tablet 0   Prenatal Vit-Fe Fumarate-FA (PRENATAL  VITAMINS PO) Take 1 tablet by mouth daily.     No facility-administered medications prior to visit.    Allergies  Allergen Reactions   Topiramate Other (See Comments)    Hands/feet tingling Hands/feet tingling     Review of Systems  Psychiatric/Behavioral:  Positive for decreased concentration and sleep disturbance. Negative for self-injury and suicidal ideas. The patient is nervous/anxious.        Grief       Objective:  Physical Exam Vitals reviewed.  Constitutional:      Comments: crying  Cardiovascular:     Rate and Rhythm: Regular rhythm. Tachycardia present.     Pulses: Normal pulses.     Heart sounds: Normal heart sounds.  Pulmonary:     Effort: Pulmonary effort is normal.     Breath sounds: Normal breath sounds.  Skin:    General: Skin is warm and dry.     Capillary Refill: Capillary refill takes less than 2 seconds.  Neurological:     Mental Status: She is alert and oriented to person, place, and time.  Psychiatric:     Comments: Crying intermittently   BP 122/60   Pulse (!) 130 Comment: Manually checked  Temp (!) 97.3 F (36.3 C)   Ht '5\' 3"'  (1.6 m)   Wt 143 lb (64.9 kg)   SpO2 98%   BMI 25.33 kg/m  BP 122/60   Pulse (!) 101   Temp (!) 97.3 F (36.3 C)   Ht '5\' 3"'  (1.6 m)   Wt 143 lb (64.9 kg)   SpO2 98%   BMI 25.33 kg/m   Wt Readings from Last 3 Encounters:  12/24/21 143 lb (64.9 kg)  09/26/21 142 lb (64.4 kg)  08/26/21 139 lb 3.2 oz (63.1 kg)    Lab Results  Component Value Date   TSH 0.901 08/26/2021   Lab Results  Component Value Date   WBC 4.9 08/26/2021   HGB 12.9 08/26/2021   HCT 39.6 08/26/2021   MCV 87 08/26/2021   PLT 246 08/26/2021   Lab Results  Component Value Date   NA 139 08/26/2021   K 5.1 08/26/2021   CO2 25 08/26/2021   GLUCOSE 87 08/26/2021   BUN 11 08/26/2021   CREATININE 0.68 08/26/2021   BILITOT 0.2 08/26/2021   ALKPHOS 68 08/26/2021   AST 18 08/26/2021   ALT 10 08/26/2021   PROT 6.7 08/26/2021    ALBUMIN 4.5 08/26/2021   CALCIUM 9.5 08/26/2021   EGFR 118 08/26/2021       Assessment & Plan:   1. Grief at loss of child - LORazepam (ATIVAN) 0.5 MG tablet; Take 1 tablet (0.5 mg total) by mouth 2 (two) times daily as needed for anxiety.  Dispense: 30 tablet; Refill: 1  2. Moderate recurrent major depression (HCC) - cariprazine (VRAYLAR) 1.5 MG capsule; Take 1 capsule (1.5 mg total) by mouth daily.  Dispense: 14 capsule; Refill: 0 - cariprazine (VRAYLAR) 1.5 MG capsule; Take 1 capsule (1.5 mg total) by mouth daily.  Dispense: 30 capsule; Refill: 2  3. GAD (generalized anxiety disorder) - LORazepam (ATIVAN) 0.5 MG tablet; Take 1 tablet (0.5 mg total) by mouth 2 (two) times daily as needed for anxiety.  Dispense: 30 tablet; Refill: 1     Take Ativan 0.5 mg as needed for anxiety Begin Vraylar 1.5 mg daily  Continue Citalopram 40 mg  Continue grief counseling Follow-up in 4 weeks    Follow-up: 4-weeks  I, Rip Harbour, NP, have reviewed all documentation for this visit. The documentation on 12/24/21 for the exam, diagnosis, procedures, and orders are all accurate and complete.   An After Visit Summary was printed and given to the patient.  Rip Harbour, NP [place] [phone]

## 2021-12-24 NOTE — Patient Instructions (Signed)
Take Ativan 0.5 mg as needed for anxiety Begin Vraylar 1.5 mg daily  Continue Citalopram 40 mg  Continue grief counseling Follow-up in 4 weeks   Managing Loss, Adult People experience loss in many different ways throughout their lives. Events such as moving, changing jobs, and losing friends can create a sense of loss. The loss may be as serious as a major health change, divorce, death of a pet, or death of a loved one. All of these types of loss are likely to create a physical and emotional reaction known as grief. Grief is the result of a major change or an absence of something or someone that you count on. Grief is a normal reaction to loss. A variety of factors can affect your grieving experience, including: The nature of your loss. Your relationship to what or whom you lost. Your understanding of grief and how to manage it. Your support system. Be aware that when grief becomes extreme, it can lead to more severe issues like isolation, depression, anxiety, or suicidal thoughts. Talk with your health care provider if you have any of these issues. How to manage lifestyle changes Keep to your normal routine as much as possible. If you have trouble focusing or doing normal activities, it is acceptable to take some time away from your normal routine. Spend time with friends and loved ones. Eat a healthy diet, get plenty of sleep, and rest when you feel tired. How to recognize changes  The way that you deal with your grief will affect your ability to function as you normally do. When grieving, you may experience these changes: Numbness, shock, sadness, anxiety, anger, denial, and guilt. Thoughts about death. Unexpected crying. A physical sensation of emptiness in your stomach. Problems sleeping and eating. Tiredness (fatigue). Loss of interest in normal activities. Dreaming about or imagining seeing the person who died. A need to remember what or whom you lost. Difficulty thinking about  anything other than your loss for a period of time. Relief. If you have been expecting the loss for a while, you may feel a sense of relief when it happens. Follow these instructions at home: Activity Express your feelings in healthy ways, such as: Talking with others about your loss. It may be helpful to find others who have had a similar loss, such as a support group. Writing down your feelings in a journal. Doing physical activities to release stress and emotional energy. Doing creative activities like painting, sculpting, or playing or listening to music. Practicing resilience. This is the ability to recover and adjust after facing challenges. Reading some resources that encourage resilience may help you to learn ways to practice those behaviors.  General instructions Be patient with yourself and others. Allow the grieving process to happen, and remember that grieving takes time. It is likely that you may never feel completely done with some grief. You may find a way to move on while still cherishing memories and feelings about your loss. Accepting your loss is a process. It can take months or longer to adjust. Keep all follow-up visits. This is important. Where to find support To get support for managing loss: Ask your health care provider for help and recommendations, such as grief counseling or therapy. Think about joining a support group for people who are managing a loss. Where to find more information You can find more information about managing loss from: American Society of Clinical Oncology: www.cancer.net American Psychological Association: TVStereos.ch Contact a health care provider if: Your grief  is extreme and keeps getting worse. You have ongoing grief that does not improve. Your body shows symptoms of grief, such as illness. You feel depressed, anxious, or hopeless. Get help right away if: You have thoughts about hurting yourself or others. Get help right away if you  feel like you may hurt yourself or others, or have thoughts about taking your own life. Go to your nearest emergency room or: Call 911. Call the [facility] at 934-134-8126 or 988. This is open 24 hours a day. Text the Crisis Text Line at 959-661-7630. Summary Grief is the result of a major change or an absence of someone or something that you count on. Grief is a normal reaction to loss. The depth of grief and the period of recovery depend on the type of loss and your ability to adjust to the change and process your feelings. Processing grief requires patience and a willingness to accept your feelings and talk about your loss with people who are supportive. It is important to find resources that work for you and to realize that people experience grief differently. There is not one grieving process that works for everyone in the same way. Be aware that when grief becomes extreme, it can lead to more severe issues like isolation, depression, anxiety, or suicidal thoughts. Talk with your health care provider if you have any of these issues. This information is not intended to replace advice given to you by your health care provider. Make sure you discuss any questions you have with your health care provider. Document Revised: 10/21/2020 Document Reviewed: 10/21/2020 Elsevier Patient Education  [place].

## 2022-01-01 DIAGNOSIS — F321 Major depressive disorder, single episode, moderate: Secondary | ICD-10-CM | POA: Diagnosis not present

## 2022-01-07 ENCOUNTER — Encounter: Payer: Self-pay | Admitting: Obstetrics and Gynecology

## 2022-01-07 ENCOUNTER — Ambulatory Visit (INDEPENDENT_AMBULATORY_CARE_PROVIDER_SITE_OTHER): Payer: BC Managed Care – PPO | Admitting: Obstetrics and Gynecology

## 2022-01-07 VITALS — BP 117/71 | HR 92 | Wt 144.0 lb

## 2022-01-07 DIAGNOSIS — Z30433 Encounter for removal and reinsertion of intrauterine contraceptive device: Secondary | ICD-10-CM

## 2022-01-07 MED ORDER — LEVONORGESTREL 13.5 MG IU IUD
1.0000 | INTRAUTERINE_SYSTEM | Freq: Once | INTRAUTERINE | Status: AC
  Administered 2022-01-07: 13.5 mg via INTRAUTERINE

## 2022-01-07 NOTE — Progress Notes (Signed)
Patient is having bleeding with her IUD and would like it removed. Kathrene Alu RN

## 2022-01-07 NOTE — Progress Notes (Signed)
GYNECOLOGY VISIT  Patient name: Bonnie Bell MRN [id number]  Date of birth: 1985-06-03 Chief Complaint:   Irregular bleeding with IUD  History:  Bonnie Bell is a 36 y.o. [id number] being seen today for irregular bleeding with IUD in place.  She states current IUD was selected due to her recently having had twins.  Patient reports previously having a Skyla in place, in which she had regular menses.  She would prefer to have a Skyla reinserted if able.  Otherwise other forms of hormonal contraception have not been able to to control her menses.  Current contraception is primarily for contraception, although does help manage her menses flow.  She is interested in having more children in the next couple of years.   She has previously tried the patch, which fell off of her skin.  Currently has a Liletta IUD, for which for which she has irregular bleeding.  Reportedly has tried Lysteda in the past, which is also not helped.  Menses are typically heavy but regular when not on birth control.   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   Health Maintenance:   Last pap 04/2020. Results were: normal Last mammogram: n/a   Review of Systems:  Pertinent items are noted in HPI. Comprehensive review of systems was otherwise negative.   Objective:  Physical Exam BP 117/71   Pulse 92   Wt 144 lb (65.3 kg)   BMI 25.51 kg/m    Physical Exam Vitals and nursing note reviewed. Exam conducted with a chaperone present.  Constitutional:      Appearance: Normal appearance.  HENT:     Head: Normocephalic and atraumatic.   Cardiovascular:     Rate and Rhythm: Normal rate and regular rhythm.  Pulmonary:     Effort: Pulmonary effort is normal.     Breath sounds: Normal breath sounds.  Genitourinary:    General: Normal vulva.     Vagina: Normal.     Cervix: Normal.     Comments: IUD strings visualized Skin:    General: Skin is warm and dry.  Neurological:     General: No focal deficit present.     Mental Status: She is alert.  Psychiatric:        Mood and Affect: Mood normal.        Behavior: Behavior normal.        Thought Content: Thought content normal.        Judgment: Judgment normal.    IUD Removal and Insertion Patient identified, informed consent performed, consent signed.   Discussed risks of irregular bleeding, cramping, infection, malpositioning or misplacement of the IUD outside the uterus which may require further procedures. Also discussed >99% contraception efficacy, increased risk of ectopic pregnancy with failure of method.   Emphasized that this did not protect against STIs, condoms recommended during all sexual encounters.Advised to use backup contraception for one week as the risk of pregnancy is higher during the transition period of removing an IUD and replacing it with another one. Time out was performed. Speculum placed in the vagina. The  strings of the IUD were grasped and pulled using ring forceps. The IUD was successfully removed in its entirety. The cervix was cleaned with Betadine x 2 and grasped anteriorly with a single tooth tenaculum.  The new Skyla IUD insertion apparatus was used to sound the uterus to 9 cm;  the IUD was then placed per manufacturer's recommendations. Strings trimmed to 2 cm. Tenaculum was removed, good hemostasis noted. Patient tolerated procedure well.   Patient was given post-procedure instructions.  She was reminded to have backup contraception for one week during this transition period between IUDs.  Patient was also asked to check IUD strings  periodically.      Assessment & Plan:   1. Encounter for removal and reinsertion of intrauterine contraceptive device (IUD) Now status post uncomplicated IUD removal and insertion.  Discussed potential bleeding patterns to be expected.  Return as needed.   Routine preventative health maintenance measures emphasized.  Darliss Cheney, MD [facility]

## 2022-01-08 ENCOUNTER — Telehealth: Payer: Self-pay

## 2022-01-08 NOTE — Telephone Encounter (Signed)
PA submitted and approved via covermymeds for Vraylar.

## 2022-01-26 ENCOUNTER — Ambulatory Visit: Payer: BC Managed Care – PPO | Admitting: Nurse Practitioner

## 2022-01-26 NOTE — Progress Notes (Deleted)
Subjective:  Patient ID: Bonnie Bell, female    DOB: July 03, 1985  Age: 35 y.o. MRN: [id number]  CC: GAD Depression  HPI Pt presents for follow-up of anxiety and depression. She lost one of her 9-monthold twins unexpectedly. She was then put under further stress with a death investigation by [employer]. She was cleared of any foul play. She is concerned with the health of her surviving twin and the mental health of her other children, especially her 166year old. She has begun grief counseling.      Anxiety, Follow-up  She was last seen for anxiety 4 weeks ago. Current treatment includes ***.   She reports {excellent/good/fair/poor:19665} compliance with treatment. She reports {good/fair/poor:18685} tolerance of treatment. She {is/is not:21021397} having side effects. {document side effects if present:1}  She feels her anxiety is {Desc; severity:60313} and {improved/worse/unchanged:3041574} since last visit.   GAD-7 Results    12/24/2021    2:55 PM 08/26/2021   11:16 AM 02/14/2021   10:50 AM  GAD-7 Generalized Anxiety Disorder Screening Tool  1. Feeling Nervous, Anxious, or on Edge 2 0 0  2. Not Being Able to Stop or Control Worrying '2 1 1  '$ 3. Worrying Too Much About Different Things 2 1 0  4. Trouble Relaxing 2 0 0  5. Being So Restless it's Hard To Sit Still 1 0 0  6. Becoming Easily Annoyed or Irritable 1 0 0  7. Feeling Afraid As If Something Awful Might Happen 2 0 0  Total GAD-7 Score '12 2 1  '$ Difficulty At Work, Home, or Getting  Along With Others? Somewhat difficult Not difficult at all Not difficult at all    PHQ-9 Scores    12/24/2021    2:54 PM 02/14/2021   10:48 AM  PHQ9 SCORE ONLY  PHQ-9 Total Score 13 2    Current Outpatient Medications on File Prior to Visit  Medication Sig Dispense Refill   cariprazine (VRAYLAR) 1.5 MG capsule Take 1 capsule (1.5 mg total) by mouth daily. (Patient not taking: Reported on 01/07/2022) 14  capsule 0   cariprazine (VRAYLAR) 1.5 MG capsule Take 1 capsule (1.5 mg total) by mouth daily. (Patient not taking: Reported on 01/07/2022) 30 capsule 2   citalopram (CELEXA) 40 MG tablet Take 1 tablet (40 mg total) by mouth daily. 90 tablet 1   LORazepam (ATIVAN) 0.5 MG tablet Take 1 tablet (0.5 mg total) by mouth 2 (two) times daily as needed for anxiety. (Patient not taking: Reported on 01/07/2022) 30 tablet 1   No current facility-administered medications on file prior to visit.   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   Headache    Past Surgical History:  Procedure Laterality Date   surgery on right foot     Cancer    Family History  Problem Relation Age of Onset   Lung cancer Mother    Kidney disease Mother    Social History   Socioeconomic History   Marital status: Married    Spouse name: WNaliya Gish  Number of children: 4   Years of education: Not on file   Highest education level: Not on file  Occupational History   Not on file  Tobacco Use   Smoking status: Never   Smokeless tobacco: Never  Vaping Use   Vaping Use: Never used  Substance and Sexual Activity   Alcohol use: Never  Drug use: Never   Sexual activity: Yes    Partners: Male  Other Topics Concern   Not on file  Social History Narrative   Not on file   Social Determinants of Health   Financial Resource Strain: Low Risk  (08/26/2021)   Overall Financial Resource Strain (CARDIA)    Difficulty of Paying Living Expenses: Not hard at all  Food Insecurity: No Food Insecurity (08/26/2021)   Hunger Vital Sign    Worried About Running Out of Food in the Last Year: Never true    Ran Out of Food in the Last Year: Never true  Transportation Needs: No Transportation Needs (08/26/2021)   PRAPARE - Hydrologist (Medical): No    Lack of Transportation (Non-Medical): No  Physical Activity: Insufficiently Active (08/26/2021)    Exercise Vital Sign    Days of Exercise per Week: 5 days    Minutes of Exercise per Session: 20 min  Stress: No Stress Concern Present (08/26/2021)   [place]    Feeling of Stress : Not at all  Social Connections: Moderately Integrated (08/26/2021)   Social Connection and Isolation Panel [NHANES]    Frequency of Communication with Friends and Family: More than three times a week    Frequency of Social Gatherings with Friends and Family: More than three times a week    Attends Religious Services: More than 4 times per year    Active Member of [employer] or Organizations: No    Attends Archivist Meetings: Never    Marital Status: Married    Review of Systems   Objective:  There were no vitals taken for this visit.     01/07/2022    3:29 PM 12/24/2021    2:52 PM 09/26/2021   11:00 AM  BP/Weight  Systolic BP [phone]  Diastolic BP 71 60 65  Wt. (Lbs) 144 143 142  BMI 25.51 kg/m2 25.33 kg/m2 29.68 kg/m2    Physical Exam  Diabetic Foot Exam - Simple   No data filed      Lab Results  Component Value Date   WBC 4.9 08/26/2021   HGB 12.9 08/26/2021   HCT 39.6 08/26/2021   PLT 246 08/26/2021   GLUCOSE 87 08/26/2021   ALT 10 08/26/2021   AST 18 08/26/2021   NA 139 08/26/2021   K 5.1 08/26/2021   CL 104 08/26/2021   CREATININE 0.68 08/26/2021   BUN 11 08/26/2021   CO2 25 08/26/2021   TSH 0.901 08/26/2021      Assessment & Plan:   Problem List Items Addressed This Visit   None .  No orders of the defined types were placed in this encounter.   No orders of the defined types were placed in this encounter.    Follow-up: No follow-ups on file.  An After Visit Summary was printed and given to the patient.  Rip Harbour, NP [place] [phone]

## 2022-02-09 ENCOUNTER — Ambulatory Visit: Payer: BC Managed Care – PPO | Admitting: Obstetrics and Gynecology

## 2022-02-09 NOTE — Progress Notes (Deleted)
    GYNECOLOGY VISIT  Patient name: Bonnie Bell MRN [id number]  Date of birth: 19-Feb-1986 Chief Complaint:   No chief complaint on file.   History:  [name] is a 36 y.o. [phone] being seen today for IUD string check. Skyla placed 10/25 following removal of Lileatta for irregular bleeding.     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   Objective:  Physical Exam There were no vitals taken for this visit.   Physical Exam   Labs and Imaging No results found.     Assessment & Plan:   There are no diagnoses linked to this encounter.   *** Routine preventative health maintenance measures emphasized.  Darliss Cheney, MD [facility]

## 2022-03-02 ENCOUNTER — Other Ambulatory Visit: Payer: Self-pay | Admitting: Nurse Practitioner

## 2022-03-02 ENCOUNTER — Ambulatory Visit: Payer: BC Managed Care – PPO | Admitting: Nurse Practitioner

## 2022-03-02 ENCOUNTER — Encounter: Payer: Self-pay | Admitting: Nurse Practitioner

## 2022-03-02 VITALS — BP 112/68 | HR 90 | Temp 97.1°F | Ht 63.0 in | Wt 145.0 lb

## 2022-03-02 DIAGNOSIS — G43829 Menstrual migraine, not intractable, without status migrainosus: Secondary | ICD-10-CM

## 2022-03-02 DIAGNOSIS — F418 Other specified anxiety disorders: Secondary | ICD-10-CM | POA: Diagnosis not present

## 2022-03-02 MED ORDER — RIZATRIPTAN BENZOATE 10 MG PO TABS: 10.0000 mg | ORAL_TABLET | ORAL | 2 refills | Status: DC | PRN

## 2022-03-02 MED ORDER — ONDANSETRON 4 MG PO TBDP: 4.0000 mg | ORAL_TABLET | Freq: Three times a day (TID) | ORAL | 1 refills | Status: DC | PRN

## 2022-03-02 MED ORDER — BUPROPION HCL 75 MG PO TABS: 75.0000 mg | ORAL_TABLET | Freq: Two times a day (BID) | ORAL | 0 refills | Status: DC

## 2022-03-02 MED ORDER — CITALOPRAM HYDROBROMIDE 40 MG PO TABS: 40.0000 mg | ORAL_TABLET | Freq: Every day | ORAL | 1 refills | Status: DC

## 2022-03-02 NOTE — Progress Notes (Signed)
Subjective:  Patient ID: Bonnie Bell, female    DOB: 1985/12/16  Age: 36 y.o. MRN: [id number]  Chief Complaint  Patient presents with   Anxiety   Depression    HPI Pt presents for follow-up of anxiety with depression. She lost one of her 78-monthold twins unexpectedly a few months ago to an unknown illness. She is currently prescribed Celexa 40 mg QD and Ativan 0.5 mg PRN. ABrayleighreports she was diagnosed with anxiety and and depression at age 36   APoojareports having chronic menstrual migraines. Not currently taking medications. She was previously prescribed Nurtec and Maxalt. States Nurtec was not covered by health insurance. Requested to resume Maxalt.     Anxiety and Depression, Follow-up:  Current treatment includes Ativan as needed and Celexa 40 mg daily. She is in counseling  every 2 weeks and meets with pastor weekly. States she does stopped Vraylar (unable to sleep well)  She reports excellent compliance with treatment. She reports excellent tolerance of treatment. She is not having side effects.  She feels her anxiety is moderate and Improved since last visit.   GAD-7 Results    03/02/2022    8:14 AM 12/24/2021    2:55 PM 08/26/2021   11:16 AM  GAD-7 Generalized Anxiety Disorder Screening Tool  1. Feeling Nervous, Anxious, or on Edge 1 2 0  2. Not Being Able to Stop or Control Worrying '1 2 1  '$ 3. Worrying Too Much About Different Things '2 2 1  '$ 4. Trouble Relaxing 1 2 0  5. Being So Restless it's Hard To Sit Still 0 1 0  6. Becoming Easily Annoyed or Irritable 1 1 0  7. Feeling Afraid As If Something Awful Might Happen 0 2 0  Total GAD-7 Score '6 12 2  '$ Difficulty At Work, Home, or Getting  Along With Others? Not difficult at all Somewhat difficult Not difficult at all    PHQ-9 Scores    03/02/2022    8:13 AM 12/24/2021    2:54 PM 02/14/2021   10:48 AM  PHQ9 SCORE ONLY  PHQ-9 Total Score '7 13 2    '$ Current Outpatient Medications on File Prior to  Visit  Medication Sig Dispense Refill   citalopram (CELEXA) 40 MG tablet Take 1 tablet (40 mg total) by mouth daily. 90 tablet 1   LORazepam (ATIVAN) 0.5 MG tablet Take 1 tablet (0.5 mg total) by mouth 2 (two) times daily as needed for anxiety. (Patient not taking: Reported on 01/07/2022) 30 tablet 1   No current facility-administered medications on file prior to visit.   Past Medical History:  Diagnosis Date   Anxiety    [place]    sarcoma on [place]   Headache    Past Surgical History:  Procedure Laterality Date   surgery on right foot     Cancer    Family History  Problem Relation Age of Onset   Lung cancer Mother    Kidney disease Mother    Social History   Socioeconomic History   Marital status: Married    Spouse name: WTakeila Thayne  Number of children: 4   Years of education: Not on file   Highest education level: Not on file  Occupational History   Not on file  Tobacco Use   Smoking status: Never   Smokeless tobacco: Never  Vaping Use   Vaping Use: Never used  Substance and Sexual Activity   Alcohol use: Never   Drug use: Never  Sexual activity: Yes    Partners: Male  Other Topics Concern   Not on file  Social History Narrative   Not on file   Social Determinants of Health   Financial Resource Strain: Low Risk  (08/26/2021)   Overall Financial Resource Strain (CARDIA)    Difficulty of Paying Living Expenses: Not hard at all  Food Insecurity: No Food Insecurity (08/26/2021)   Hunger Vital Sign    Worried About Running Out of Food in the Last Year: Never true    Ran Out of Food in the Last Year: Never true  Transportation Needs: No Transportation Needs (08/26/2021)   PRAPARE - Hydrologist (Medical): No    Lack of Transportation (Non-Medical): No  Physical Activity: Insufficiently Active (08/26/2021)   Exercise Vital Sign    Days of Exercise per Week: 5 days    Minutes of Exercise per Session: 20 min  Stress: No  Stress Concern Present (08/26/2021)   [place]    Feeling of Stress : Not at all  Social Connections: Moderately Integrated (08/26/2021)   Social Connection and Isolation Panel [NHANES]    Frequency of Communication with Friends and Family: More than three times a week    Frequency of Social Gatherings with Friends and Family: More than three times a week    Attends Religious Services: More than 4 times per year    Active Member of [employer] or Organizations: No    Attends Archivist Meetings: Never    Marital Status: Married    Review of Systems  Constitutional:  Negative for chills, fatigue and fever.  HENT:  Negative for congestion, ear pain, rhinorrhea and sore throat.   Respiratory:  Negative for cough and shortness of breath.   Cardiovascular:  Negative for chest pain.  Gastrointestinal:  Negative for abdominal pain, constipation, diarrhea, nausea and vomiting.  Genitourinary:  Negative for dysuria and urgency.  Musculoskeletal:  Negative for back pain and myalgias.  Neurological:  Negative for dizziness, weakness, light-headedness and headaches.  Psychiatric/Behavioral:  Negative for dysphoric mood. The patient is not nervous/anxious.      Objective:  BP 112/68   Pulse 90   Temp (!) 97.1 F (36.2 C)   Ht '5\' 3"'$  (1.6 m)   Wt 145 lb (65.8 kg)   SpO2 98%   BMI 25.69 kg/m       03/02/2022    8:09 AM 01/07/2022    3:29 PM 12/24/2021    2:52 PM  BP/Weight  Systolic BP  409 811  Diastolic BP  71 60  Wt. (Lbs) 145 144 143  BMI 25.69 kg/m2 25.51 kg/m2 25.33 kg/m2    Physical Exam Vitals reviewed.  Constitutional:      Appearance: Normal appearance.  Skin:    General: Skin is warm and dry.     Capillary Refill: Capillary refill takes less than 2 seconds.  Neurological:     Mental Status: She is alert and oriented to person, place, and time.  Psychiatric:        Mood and Affect: Mood normal.         Behavior: Behavior normal.      Lab Results  Component Value Date   WBC 4.9 08/26/2021   HGB 12.9 08/26/2021   HCT 39.6 08/26/2021   PLT 246 08/26/2021   GLUCOSE 87 08/26/2021   ALT 10 08/26/2021   AST 18 08/26/2021   NA 139 08/26/2021  K 5.1 08/26/2021   CL 104 08/26/2021   CREATININE 0.68 08/26/2021   BUN 11 08/26/2021   CO2 25 08/26/2021   TSH 0.901 08/26/2021      Assessment & Plan:   1. Menstrual migraine without status migrainosus, not intractable - rizatriptan (MAXALT) 10 MG tablet; Take 1 tablet (10 mg total) by mouth as needed for migraine. May repeat in 2 hours if needed  Dispense: 10 tablet; Refill: 2 - ondansetron (ZOFRAN-ODT) 4 MG disintegrating tablet; Take 1 tablet (4 mg total) by mouth every 8 (eight) hours as needed for nausea or vomiting.  Dispense: 30 tablet; Refill: 1  2. Anxiety with depression - buPROPion (WELLBUTRIN) 75 MG tablet; Take 1 tablet (75 mg total) by mouth 2 (two) times daily.  Dispense: 60 tablet; Refill: 0   Begin Wellbutrin 75 mg twice daily for depression Begin Maxalt 10 mg as needed for menstrual migraines Take Zofran as needed for nausea Continue counseling and Celexa 40 mg QD Follow-up 4 weeks    Follow-up: 4-weeks  An After Visit Summary was printed and given to the patient.  I, Rip Harbour, NP, have reviewed all documentation for this visit. The documentation on 03/02/22 for the exam, diagnosis, procedures, and orders are all accurate and complete.    Signed,  Rip Harbour, NP [place] [phone]

## 2022-03-02 NOTE — Patient Instructions (Signed)
Begin Wellbutrin 75 mg twice daily for depression Begin Maxalt 10 mg as needed for menstrual migraines Take Zofran as needed for nausea Continue counseling and Celexa 40 mg QD Follow-up 4 weeks  Chronic Migraine Headache A migraine is a type of headache that is usually stronger and more sudden than other headaches. Migraines are characterized by an intense pulsing, throbbing pain that is usually only present on one side of the head. Migraine pain usually gets worse with activity. Migraines can cause nausea, vomiting, sensitivity to light and sound, and vision changes. Migraines that keep coming back are called recurrent migraines. A migraine is called a chronic migraine if it happens at least 15 days in a month for more than 3 months. Talk with your health care provider about what things may bring on (trigger) your migraines. What are the causes? The exact cause of this condition is not known. However, a migraine may be caused when nerves in the brain become irritated and release chemicals that cause inflammation of blood vessels. The inflammation of the blood vessels causes pain. Migraines may be triggered or caused by: Smoking. Certain foods and drinks, such as: Aged cheese. Chocolate. Alcohol. Caffeine. Foods or drinks that contain nitrates, glutamate, aspartame, MSG, or tyramine. Medicines, such as birth control pills or some blood pressure medicines. Other things that may trigger a migraine include: Menstruation. Emotional stress. Lack of sleep or too much sleep. Tiredness (fatigue). Bright lights or loud noises. Odors. Weather changes and high altitude. What increases the risk? The following factors may make you more likely to experience chronic migraine: Having migraines or a family history of migraines. Having a mental health condition, such as depression or anxiety. Having to take a lot of pain medicine. Having sleep problems. Having heart disease, diabetes, or  obesity. What are the signs or symptoms? Symptoms of a migraine vary for each person and may include: Pulsating or throbbing pain. Pain that is usually only present on one side of the head. In some cases, the pain may be on both sides of the head or around the head or neck. Severe pain that prevents you from doing daily activities. Pain that gets worse with physical activity. Nausea, vomiting, or both. Pain with exposure to bright lights, loud noises, or activity. General sensitivity to bright lights, loud noises, or smells. Dizziness. A sign that a migraine is becoming chronic is an increasing number of migraine episodes. It is considered chronic if the migraine happens at least 15 days in a month for more than 3 months. How is this diagnosed? This condition is often diagnosed based on: Your symptoms and medical history. A physical exam. You may also have tests, including: A CT scan or an MRI of your brain. These imaging tests cannot diagnose migraines, but they can help to rule out other causes of headaches. Taking fluid from the spine (lumbar puncture) and analyzing it (cerebrospinal fluid analysis, or CSF analysis). Blood tests. How is this treated? This condition is treated with: Medicines. These help to: Lessen pain and nausea. Prevent migraines. Lifestyle changes, such as changes to your diet or sleeping patterns. Behavior therapy. This may include: Relaxation training. Biofeedback. This is a treatment that teaches you to relax and use your brain to lower your heart rate and control your breathing. Cognitive behavioral therapy (CBT). This is a form of talk therapy. This therapy helps you set goals and follow up on the changes that you make. Acupuncture. Using a device that provides electrical stimulation to your nerves,  which can relieve pain (neuromodulation therapy). Surgery, if the other treatments are not working. Follow these instructions at home: Medicines Take  over-the-counter and prescription medicines only as told by your health care provider. Ask your health care provider if the medicine prescribed to you requires you to avoid driving or using machinery. Lifestyle Do not use any products that contain nicotine or tobacco, such as cigarettes, e-cigarettes, and chewing tobacco. If you need help quitting, ask your health care provider. Do not drink alcohol. Get 7-9 hours of sleep each night, or the amount of sleep recommended by your health care provider. Find ways to manage stress, such as meditation, deep breathing, or yoga. Maintain a healthy weight. If you need help losing weight, ask your health care provider. Exercise regularly. Aim for 150 minutes of moderate-intensity exercise, such as walking, biking, or yoga, or 75 minutes of vigorous exercise each week. Vigorous exercise includes running, circuit training, and swimming. General instructions Keep a journal to find out what triggers your migraines so you can avoid these triggers. For example, write down: What you eat and drink. How much sleep you get. Any change to your diet or medicines. Lie down in a dark, quiet room when you have a migraine. Try placing a cool towel over your head when you have a migraine. Keep lights dim, if bright lights bother you or make your migraines worse. Keep all follow-up visits as told by your health care provider. This is important. Where to find more information Coalition for Headache and Migraine Patients (CHAMP): headachemigraine.org American Migraine Foundation: americanmigrainefoundation.org National Headache Foundation: headaches.org Contact a health care provider if: Your pain does not improve, even with medicine. Your migraines continue to return, even with medicine. Get help right away if: Your migraine becomes severe and medicine does not help. You have a stiff neck and fever. You have a loss of vision. You have muscle weakness or loss of  muscle control. You start losing your balance, or you have trouble walking. You feel like you may faint, or you faint. You start having sudden and unexpected, severe headaches. You have a seizure. Summary Migraine headaches are usually stronger and more sudden than other headaches. Migraines are characterized by an intense pulsing, throbbing pain that is usually only present on one side of the head. Migraines that keep coming back are called recurrent migraines. A migraine is called a chronic migraine if it happens 15 days in a month for more than 3 months. Certain things may trigger migraines, such as lack of sleep or too much sleep, smoking, certain foods, alcohol, stress, and certain medicines. Your treatment plan may include medicines, lifestyle changes, and behavior therapy. This information is not intended to replace advice given to you by your health care provider. Make sure you discuss any questions you have with your health care provider. Document Revised: 08/14/2021 Document Reviewed: 04/19/2019 Elsevier Patient Education  [place].

## 2022-03-28 DIAGNOSIS — J111 Influenza due to unidentified influenza virus with other respiratory manifestations: Secondary | ICD-10-CM | POA: Diagnosis not present

## 2022-03-28 DIAGNOSIS — R051 Acute cough: Secondary | ICD-10-CM | POA: Diagnosis not present

## 2022-03-29 NOTE — Progress Notes (Deleted)
Subjective:  Patient ID: Bonnie Bell, female    DOB: 10/29/1985  Age: 37 y.o. MRN: [id number]  CC: Anxiety Depression  HPI    Anxiety, Follow-up  She was last seen for anxiety 4 weeks ago. Current treatment includes Citalopram, Ativan, and counseling.   She reports {excellent/good/fair/poor:19665} compliance with treatment. She reports {good/fair/poor:18685} tolerance of treatment. She {is/is not:21021397} having side effects. {document side effects if present:1}  She feels her anxiety is {Desc; severity:60313} and {improved/worse/unchanged:3041574} since last visit.   GAD-7 Results    03/02/2022    8:14 AM 12/24/2021    2:55 PM 08/26/2021   11:16 AM  GAD-7 Generalized Anxiety Disorder Screening Tool  1. Feeling Nervous, Anxious, or on Edge 1 2 0  2. Not Being Able to Stop or Control Worrying 1 2 1  $ 3. Worrying Too Much About Different Things 2 2 1  $ 4. Trouble Relaxing 1 2 0  5. Being So Restless it's Hard To Sit Still 0 1 0  6. Becoming Easily Annoyed or Irritable 1 1 0  7. Feeling Afraid As If Something Awful Might Happen 0 2 0  Total GAD-7 Score 6 12 2  $ Difficulty At Work, Home, or Getting  Along With Others? Not difficult at all Somewhat difficult Not difficult at all    PHQ-9 Scores    03/02/2022    8:13 AM 12/24/2021    2:54 PM 02/14/2021   10:48 AM  PHQ9 SCORE ONLY  PHQ-9 Total Score 7 13 2    $ Depression, Follow-up  She  was last seen for this 4 weeks ago. Current treatment includes Citalopram 40 mg QD and Wellbutrin 75 mg BID.   She reports {excellent/good/fair/poor:19665} compliance with treatment. She {is/is not:21021397} having side effects. ***  She reports {DESC; GOOD/FAIR/POOR:18685} tolerance of treatment. Current symptoms include: {Symptoms; depression:1002} She feels she is {improved/worse/unchanged:3041574} since last visit.     03/02/2022    8:13 AM 12/24/2021    2:54 PM 02/14/2021   10:48 AM  Depression screen PHQ 2/9  Decreased  Interest 1 2 0  Down, Depressed, Hopeless 0 2 0  PHQ - 2 Score 1 4 0  Altered sleeping 2 3 1  $ Tired, decreased energy 2 2 1  $ Change in appetite 0 2 0  Feeling bad or failure about yourself  0 0 0  Trouble concentrating 1 1 0  Moving slowly or fidgety/restless 1 1 0  Suicidal thoughts 0 0 0  PHQ-9 Score 7 13 2  $ Difficult doing work/chores Not difficult at all Not difficult at all Not difficult at all      Current Outpatient Medications on File Prior to Visit  Medication Sig Dispense Refill  . buPROPion (WELLBUTRIN) 75 MG tablet Take 1 tablet (75 mg total) by mouth 2 (two) times daily. 60 tablet 0  . citalopram (CELEXA) 40 MG tablet Take 1 tablet (40 mg total) by mouth daily. 90 tablet 1  . LORazepam (ATIVAN) 0.5 MG tablet Take 1 tablet (0.5 mg total) by mouth 2 (two) times daily as needed for anxiety. (Patient not taking: Reported on 01/07/2022) 30 tablet 1  . ondansetron (ZOFRAN-ODT) 4 MG disintegrating tablet Take 1 tablet (4 mg total) by mouth every 8 (eight) hours as needed for nausea or vomiting. 30 tablet 1  . rizatriptan (MAXALT) 10 MG tablet Take 1 tablet (10 mg total) by mouth as needed for migraine. May repeat in 2 hours if needed 10 tablet 2   No current facility-administered medications on file  prior to visit.   Past Medical History:  Diagnosis Date  . Anxiety   . 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 Headache    Past Surgical History:  Procedure Laterality Date  . surgery on right foot     Cancer    Family History  Problem Relation Age of Onset  . Lung cancer Mother   . Kidney disease Mother    Social History   Socioeconomic History  . Marital status: Married    Spouse name: Markan Kilmer  . Number of children: 4  . Years of education: Not on file  . Highest education level: Not on file  Occupational History  . Not on file  Tobacco Use  . Smoking status: Never  . Smokeless tobacco: Never  Vaping Use  . Vaping Use: Never used  Substance and Sexual  Activity  . Alcohol use: Never  . Drug use: Never  . Sexual activity: Yes    Partners: Male  Other Topics Concern  . Not on file  Social History Narrative  . Not on file   Social Determinants of Health   Financial Resource Strain: Low Risk  (08/26/2021)   Overall Financial Resource Strain (CARDIA)   . Difficulty of Paying Living Expenses: Not hard at all  Food Insecurity: No Food Insecurity (08/26/2021)   Hunger Vital Sign   . Worried About Charity fundraiser in the Last Year: Never true   . Ran Out of Food in the Last Year: Never true  Transportation Needs: No Transportation Needs (08/26/2021)   PRAPARE - Transportation   . Lack of Transportation (Medical): No   . Lack of Transportation (Non-Medical): No  Physical Activity: Insufficiently Active (08/26/2021)   Exercise Vital Sign   . Days of Exercise per Week: 5 days   . Minutes of Exercise per Session: 20 min  Stress: No Stress Concern Present (08/26/2021)   [place]   . Feeling of Stress : Not at all  Social Connections: Moderately Integrated (08/26/2021)   Social Connection and Isolation Panel [NHANES]   . Frequency of Communication with Friends and Family: More than three times a week   . Frequency of Social Gatherings with Friends and Family: More than three times a week   . Attends Religious Services: More than 4 times per year   . Active Member of Clubs or Organizations: No   . Attends Archivist Meetings: Never   . Marital Status: Married    Review of Systems   Objective:       03/02/2022    8:09 AM 01/07/2022    3:29 PM 12/24/2021    2:52 PM  BP/Weight  Systolic BP [id number]  Diastolic BP 68 71 60  Wt. (Lbs) 145 144 143  BMI 25.69 kg/m2 25.51 kg/m2 25.33 kg/m2    Physical Exam Vitals reviewed.  Constitutional:      Appearance: Normal appearance.  Skin:    General: Skin is warm.     Capillary Refill: Capillary refill  takes less than 2 seconds.  Neurological:     Mental Status: She is alert and oriented to person, place, and time.  Psychiatric:        Mood and Affect: Mood normal.        Behavior: Behavior normal.       Lab Results  Component Value Date   WBC 4.9 08/26/2021   HGB 12.9 08/26/2021  HCT 39.6 08/26/2021   PLT 246 08/26/2021   GLUCOSE 87 08/26/2021   ALT 10 08/26/2021   AST 18 08/26/2021   NA 139 08/26/2021   K 5.1 08/26/2021   CL 104 08/26/2021   CREATININE 0.68 08/26/2021   BUN 11 08/26/2021   CO2 25 08/26/2021   TSH 0.901 08/26/2021      Assessment & Plan:   There are no diagnoses linked to this encounter.   No orders of the defined types were placed in this encounter.   No orders of the defined types were placed in this encounter.    Follow-up: No follow-ups on file.  An After Visit Summary was printed and given to the patient.  Rip Harbour, NP [place] [phone]

## 2022-03-30 ENCOUNTER — Ambulatory Visit: Payer: BC Managed Care – PPO | Admitting: Nurse Practitioner

## 2022-03-30 DIAGNOSIS — F418 Other specified anxiety disorders: Secondary | ICD-10-CM

## 2022-04-06 ENCOUNTER — Ambulatory Visit: Payer: BC Managed Care – PPO | Admitting: Nurse Practitioner

## 2022-04-29 ENCOUNTER — Encounter: Payer: Self-pay | Admitting: Physician Assistant

## 2022-04-29 ENCOUNTER — Ambulatory Visit: Payer: BC Managed Care – PPO | Admitting: Physician Assistant

## 2022-04-29 VITALS — BP 102/80 | HR 88 | Temp 97.2°F | Resp 14 | Ht 60.0 in | Wt 138.0 lb

## 2022-04-29 DIAGNOSIS — M778 Other enthesopathies, not elsewhere classified: Secondary | ICD-10-CM | POA: Diagnosis not present

## 2022-04-29 DIAGNOSIS — G5601 Carpal tunnel syndrome, right upper limb: Secondary | ICD-10-CM | POA: Insufficient documentation

## 2022-04-29 DIAGNOSIS — F418 Other specified anxiety disorders: Secondary | ICD-10-CM

## 2022-04-29 MED ORDER — MELOXICAM 7.5 MG PO TABS: 7.5000 mg | ORAL_TABLET | Freq: Every day | ORAL | 0 refills | Status: DC

## 2022-04-29 MED ORDER — BUPROPION HCL 75 MG PO TABS: 75.0000 mg | ORAL_TABLET | Freq: Two times a day (BID) | ORAL | 2 refills | Status: DC

## 2022-04-29 MED ORDER — PREDNISONE 20 MG PO TABS: ORAL_TABLET | ORAL | 0 refills | Status: AC

## 2022-04-29 NOTE — Progress Notes (Signed)
Acute Office Visit  Subjective:    Patient ID: Bonnie Bell, female    DOB: 06/02/1985, 37 y.o.   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 Has been using a wrist brace.    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   Headache     Past Surgical History:  Procedure Laterality Date   surgery on right foot     Cancer    Family History  Problem Relation Age of Onset   Lung cancer Mother    Kidney disease Mother     Social History   Socioeconomic History   Marital status: Married    Spouse name: Havin Heeney   Number of children: 4   Years of education: Not on file   Highest education level: Not on file  Occupational History   Not on file  Tobacco Use   Smoking status: Never   Smokeless tobacco: Never  Vaping Use   Vaping Use: Never used  Substance and Sexual Activity   Alcohol use: Never   Drug use: Never   Sexual activity: Yes    Partners: Male  Other Topics Concern   Not on file  Social History Narrative   Not on file   Social Determinants of Health   Financial Resource Strain: Low Risk  (08/26/2021)   Overall Financial Resource Strain (CARDIA)    Difficulty of Paying Living Expenses: Not hard at all  Food Insecurity: No Food Insecurity (08/26/2021)   Hunger Vital Sign    Worried About Running Out of Food in the Last Year: Never true    Ran Out of Food in the Last Year: Never true  Transportation Needs: No Transportation Needs (08/26/2021)   PRAPARE - Armed forces logistics/support/administrative officer (Medical): No    Lack of Transportation (Non-Medical): No  Physical Activity: Insufficiently Active (08/26/2021)   Exercise Vital Sign    Days of Exercise per Week: 5 days    Minutes of Exercise per Session: 20 min  Stress: No Stress Concern Present (08/26/2021)   [place]    Feeling of Stress : Not at all  Social Connections: Moderately Integrated (08/26/2021)   Social Connection and Isolation Panel [NHANES]    Frequency of Communication with Friends and Family: More than three times a week    Frequency of Social Gatherings with Friends and Family: More than three times a week    Attends Religious Services: More than 4 times per year    Active Member of [employer] or Organizations: No    Attends Archivist Meetings: Never    Marital Status: Married  Human resources officer Violence: Not At Risk (08/26/2021)   Humiliation, Afraid, Rape, and Kick questionnaire    Fear of Current or Ex-Partner: No    Emotionally Abused: No    Physically Abused: No    Sexually Abused: No     Current Outpatient  Medications:    meloxicam (MOBIC) 7.5 MG tablet, Take 1 tablet (7.5 mg total) by mouth daily., Disp: 30 tablet, Rfl: 0   predniSONE (DELTASONE) 20 MG tablet, Take 3 tablets (60 mg total) by mouth daily with breakfast for 3 days, THEN 2 tablets (40 mg total) daily with breakfast for 3 days, THEN 1 tablet (20 mg total) daily with breakfast for 3 days., Disp: 18 tablet, Rfl: 0   buPROPion (WELLBUTRIN) 75 MG tablet, Take 1 tablet (75 mg total) by mouth 2 (two) times daily., Disp: 60 tablet, Rfl: 2   citalopram (CELEXA) 40 MG tablet, Take 1 tablet (40 mg total) by mouth daily., Disp: 90 tablet, Rfl: 1   LORazepam (ATIVAN) 0.5 MG tablet, Take 1 tablet (0.5 mg total) by mouth 2 (two) times daily as needed for anxiety. (Patient not taking: Reported on 01/07/2022), Disp: 30 tablet, Rfl: 1   ondansetron (ZOFRAN-ODT) 4 MG  disintegrating tablet, Take 1 tablet (4 mg total) by mouth every 8 (eight) hours as needed for nausea or vomiting., Disp: 30 tablet, Rfl: 1   rizatriptan (MAXALT) 10 MG tablet, Take 1 tablet (10 mg total) by mouth as needed for migraine. May repeat in 2 hours if needed, Disp: 10 tablet, Rfl: 2   Allergies  Allergen Reactions   Topiramate Other (See Comments)    Hands/feet tingling Hands/feet tingling     CONSTITUTIONAL: Negative for chills, fatigue, fever,   CARDIOVASCULAR: Negative for chest pain, dizziness,  RESPIRATORY: Negative for recent cough and dyspnea.   MSK: see HPI  INTEGUMENTARY: Negative for rash.          Objective:    PHYSICAL EXAM:   VS: BP 102/80   Pulse 88   Temp (!) 97.2 F (36.2 C)   Resp 14   Ht 5' (1.524 m)   Wt 138 lb (62.6 kg)   SpO2 97%   BMI 26.95 kg/m   GEN: Well nourished, well developed, in no acute distress  Cardiac: RRR; no murmurs,  Respiratory:  normal respiratory rate and pattern with no distress - normal breath sounds with no rales, rhonchi, wheezes or rubs MS: tender right thenar eminence --- negative Tinels and Phalens signs Skin: warm and dry, no rash     Wt Readings from Last 3 Encounters:  04/29/22 138 lb (62.6 kg)  03/02/22 145 lb (65.8 kg)  01/07/22 144 lb (65.3 kg)    There are no preventive care reminders to display for this patient.  There are no preventive care reminders to display for this patient.        Assessment & Plan:   Problem List Items Addressed This Visit       Nervous and Auditory   Right thumb tendonitis   Relevant Medications   predniSONE (DELTASONE) 20 MG tablet   meloxicam (MOBIC) 7.5 MG tablet   Do stretching exercises/use raquetball   Other Visit Diagnoses     Anxiety with depression       Relevant Medications   buPROPion (WELLBUTRIN) 75 MG tablet        Meds ordered this encounter  Medications   predniSONE (DELTASONE) 20 MG tablet    Sig: Take 3 tablets (60 mg total) by  mouth daily with breakfast for 3 days, THEN 2 tablets (40 mg total) daily with breakfast for 3 days, THEN 1 tablet (20 mg total) daily with breakfast for 3 days.    Dispense:  18 tablet    Refill:  0    Order  Specific Question:   Supervising Provider    Answer:   Shelton Silvas   meloxicam (MOBIC) 7.5 MG tablet    Sig: Take 1 tablet (7.5 mg total) by mouth daily.    Dispense:  30 tablet    Refill:  0    Order Specific Question:   Supervising Provider    Answer:   Shelton Silvas   buPROPion (WELLBUTRIN) 75 MG tablet    Sig: Take 1 tablet (75 mg total) by mouth 2 (two) times daily.    Dispense:  60 tablet    Refill:  2    Order Specific Question:   Supervising Provider    Answer:   COX, Elnita Maxwell [id number]     [place], PA-C

## 2022-06-11 DIAGNOSIS — G43909 Migraine, unspecified, not intractable, without status migrainosus: Secondary | ICD-10-CM | POA: Diagnosis not present

## 2022-06-11 DIAGNOSIS — D497 Neoplasm of unspecified behavior of endocrine glands and other parts of nervous system: Secondary | ICD-10-CM | POA: Diagnosis not present

## 2022-06-11 DIAGNOSIS — S0083XA Contusion of other part of head, initial encounter: Secondary | ICD-10-CM | POA: Diagnosis not present

## 2022-06-11 DIAGNOSIS — W19XXXA Unspecified fall, initial encounter: Secondary | ICD-10-CM | POA: Diagnosis not present

## 2022-06-11 DIAGNOSIS — R9431 Abnormal electrocardiogram [ECG] [EKG]: Secondary | ICD-10-CM | POA: Diagnosis not present

## 2022-06-11 DIAGNOSIS — Z743 Need for continuous supervision: Secondary | ICD-10-CM | POA: Diagnosis not present

## 2022-06-11 DIAGNOSIS — G939 Disorder of brain, unspecified: Secondary | ICD-10-CM | POA: Diagnosis not present

## 2022-06-11 DIAGNOSIS — G43801 Other migraine, not intractable, with status migrainosus: Secondary | ICD-10-CM | POA: Diagnosis not present

## 2022-06-11 DIAGNOSIS — R531 Weakness: Secondary | ICD-10-CM | POA: Diagnosis not present

## 2022-06-11 DIAGNOSIS — Z043 Encounter for examination and observation following other accident: Secondary | ICD-10-CM | POA: Diagnosis not present

## 2022-06-11 DIAGNOSIS — G911 Obstructive hydrocephalus: Secondary | ICD-10-CM | POA: Diagnosis not present

## 2022-06-11 DIAGNOSIS — G379 Demyelinating disease of central nervous system, unspecified: Secondary | ICD-10-CM | POA: Diagnosis not present

## 2022-06-11 DIAGNOSIS — D329 Benign neoplasm of meninges, unspecified: Secondary | ICD-10-CM | POA: Diagnosis not present

## 2022-06-11 DIAGNOSIS — G919 Hydrocephalus, unspecified: Secondary | ICD-10-CM | POA: Diagnosis not present

## 2022-06-11 DIAGNOSIS — G914 Hydrocephalus in diseases classified elsewhere: Secondary | ICD-10-CM | POA: Diagnosis not present

## 2022-06-11 DIAGNOSIS — R55 Syncope and collapse: Secondary | ICD-10-CM | POA: Diagnosis not present

## 2022-06-12 DIAGNOSIS — R55 Syncope and collapse: Secondary | ICD-10-CM | POA: Diagnosis not present

## 2022-06-12 DIAGNOSIS — G939 Disorder of brain, unspecified: Secondary | ICD-10-CM | POA: Diagnosis not present

## 2022-06-12 DIAGNOSIS — D496 Neoplasm of unspecified behavior of brain: Secondary | ICD-10-CM | POA: Diagnosis not present

## 2022-06-12 DIAGNOSIS — G936 Cerebral edema: Secondary | ICD-10-CM | POA: Diagnosis not present

## 2022-06-12 DIAGNOSIS — D332 Benign neoplasm of brain, unspecified: Secondary | ICD-10-CM | POA: Diagnosis not present

## 2022-06-12 DIAGNOSIS — G935 Compression of brain: Secondary | ICD-10-CM | POA: Diagnosis not present

## 2022-06-19 DIAGNOSIS — D496 Neoplasm of unspecified behavior of brain: Secondary | ICD-10-CM | POA: Diagnosis not present

## 2022-06-19 DIAGNOSIS — C719 Malignant neoplasm of brain, unspecified: Secondary | ICD-10-CM | POA: Diagnosis not present

## 2022-06-19 DIAGNOSIS — D329 Benign neoplasm of meninges, unspecified: Secondary | ICD-10-CM | POA: Diagnosis not present

## 2022-06-19 DIAGNOSIS — F32A Depression, unspecified: Secondary | ICD-10-CM | POA: Diagnosis not present

## 2022-06-19 DIAGNOSIS — L821 Other seborrheic keratosis: Secondary | ICD-10-CM | POA: Diagnosis not present

## 2022-06-19 DIAGNOSIS — F419 Anxiety disorder, unspecified: Secondary | ICD-10-CM | POA: Diagnosis not present

## 2022-06-19 DIAGNOSIS — E876 Hypokalemia: Secondary | ICD-10-CM | POA: Diagnosis not present

## 2022-06-19 DIAGNOSIS — D320 Benign neoplasm of cerebral meninges: Secondary | ICD-10-CM | POA: Diagnosis not present

## 2022-06-19 DIAGNOSIS — R52 Pain, unspecified: Secondary | ICD-10-CM | POA: Diagnosis not present

## 2022-06-19 DIAGNOSIS — G9389 Other specified disorders of brain: Secondary | ICD-10-CM | POA: Diagnosis not present

## 2022-06-19 DIAGNOSIS — Z9889 Other specified postprocedural states: Secondary | ICD-10-CM | POA: Diagnosis not present

## 2022-06-19 DIAGNOSIS — G43909 Migraine, unspecified, not intractable, without status migrainosus: Secondary | ICD-10-CM | POA: Diagnosis not present

## 2022-06-19 HISTORY — PX: CRANIECTOMY: SHX331

## 2022-06-24 ENCOUNTER — Telehealth: Payer: Self-pay

## 2022-06-24 NOTE — Telephone Encounter (Signed)
I left a message on the number(s) listed in the patients chart requesting the patient to call back regarding the upcomming appointment for 08/03/2022. The provider is out of the office that day. The appointment has been canceled. Waiting for the patient to return the call.   Please schedule the appointment at [facility] next opening.

## 2022-06-25 ENCOUNTER — Encounter: Payer: Self-pay | Admitting: *Deleted

## 2022-06-25 ENCOUNTER — Telehealth: Payer: Self-pay | Admitting: *Deleted

## 2022-06-25 NOTE — Transitions of Care (Post Inpatient/ED Visit) (Signed)
   06/25/2022  Name: Bonnie Bell MRN: [id number] DOB: Jun 17, 1985  Today's TOC FU Call Status: Today's TOC FU Call Status:: Unsuccessul Call (1st Attempt) Unsuccessful Call (1st Attempt) Date: 06/25/22  Attempted to reach the patient regarding the most recent Inpatient visit; left HIPAA compliant voice message requesting call back  Follow Up Plan: Additional outreach attempts will be made to reach the patient to complete the Transitions of Care (Post Inpatient visit) call.   Caryl Pina, RN, BSN, CCRN Alumnus RN CM Care Coordination/ Transition of Care- [facility] Care Management [phone]: direct office

## 2022-06-26 ENCOUNTER — Telehealth: Payer: Self-pay | Admitting: *Deleted

## 2022-06-26 ENCOUNTER — Encounter: Payer: Self-pay | Admitting: *Deleted

## 2022-06-26 DIAGNOSIS — Z09 Encounter for follow-up examination after completed treatment for conditions other than malignant neoplasm: Secondary | ICD-10-CM | POA: Diagnosis not present

## 2022-06-26 DIAGNOSIS — D496 Neoplasm of unspecified behavior of brain: Secondary | ICD-10-CM | POA: Diagnosis not present

## 2022-06-26 NOTE — Transitions of Care (Post Inpatient/ED Visit) (Signed)
06/26/2022  Name: Bonnie Bell MRN: [id number] DOB: [birth year]  Today's TOC FU Call Status: Today's TOC FU Call Status:: Successful TOC FU Call Competed TOC FU Call Complete Date: 06/26/22  Transition Care Management Follow-up Telephone Call Date of Discharge: 06/24/22 Discharge Facility: Other ([facility]) Name of Other ([facility]) Discharge Facility: [facility] Type of Discharge: Inpatient Admission Primary Inpatient Discharge Diagnosis:: brain tumor: (R) parietal craniotomy with re-section of brain mass How have you been since you were released from the hospital?: Better ("I am doing better than I thought I would.  Not having any problems.  My husband is helping me with everything.  I am going today at 3:00 pm to see my neurosurgeon for my follow up visit.  I am walking like they told me to, to prevent blood clots") Any questions or concerns?: Yes Patient Questions/Concerns:: Patient/ husband questioning if patient should be on ACT for prevention of blood clots, post-recent surgery Patient Questions/Concerns Addressed: Other: (Provided education and discussed talking points about clot prevention post-recent surgery and encouraged patient and spouse to specifically ask neurosurgeon about their question at time of follow up visit this afternoon- they verbalize understanding)  Items Reviewed: Did you receive and understand the discharge instructions provided?: Yes (thoroughly reviewed with patient and her spouse who both verbalize very good understanding of same) Medications obtained and verified?: Yes (Medications Reviewed) (Full medication review completed; no concerns or discrepancies identified; confirmed patient obtained/ is taking all newly Rx'd medications as instructed; self-manages medications and denies questions/ concerns around medications today) Any new allergies since your discharge?: No Dietary orders reviewed?: Yes Type of Diet Ordered:: "Regular"-- "My appetitie is  improving" Do you have support at home?: Yes People in Home: spouse Name of Support/Comfort Primary Source: Patient reports essentially independent in self-care activities: supportive spouse assists as needed/ indicated  Home Care and Equipment/Supplies: Were Home Health Services Ordered?: No (agrees to discuss need for recommended PT with neurosurgeon at time of hospital follow up this afternoon: discuused/ provided education around difference between outpatient and home health PT/ need for MD order/ referral for initiation of services) Any new equipment or medical supplies ordered?: No  Functional Questionnaire: Do you need assistance with bathing/showering or dressing?: No (husband assisting as needed/ indicated) Do you need assistance with meal preparation?: No (husband assisting as needed/ indicated) Do you need assistance with eating?: No Do you have difficulty maintaining continence: No Do you need assistance with getting out of bed/getting out of a chair/moving?: No (husband assisting as needed/ indicated) Do you have difficulty managing or taking your medications?: No (husband assisting as needed/ indicated)  Follow up appointments reviewed: PCP Follow-up appointment confirmed?: No (message sent to PCP Office team to facilitate scheduling of hospital follow up visit- as per PCP practice request) MD Provider Line Number:[phone] Given: No Specialist Hospital Follow-up appointment confirmed?: Yes Date of Specialist follow-up appointment?: 06/26/22 Follow-Up Specialty Provider:: Neurosurgeon at [facility] Do you need transportation to your follow-up appointment?: No Do you understand care options if your condition(s) worsen?: Yes-patient verbalized understanding  SDOH Interventions Today    Flowsheet Row Most Recent Value  SDOH Interventions   Food Insecurity Interventions Intervention Not Indicated  Transportation Interventions Intervention Not Indicated  [husband  currently providing transportation]      TOC Interventions Today    Flowsheet Row Most Recent Value  TOC Interventions   TOC Interventions Discussed/Reviewed TOC Interventions Discussed, Post op wound/incision care  [sent message to PCP to facilitate scheduling of HFU as  per practice request,  provided my direct contact information should questions/ concerns/ needs arise post-TOC call, prior to RN CM telephone visit]      Interventions Today    Flowsheet Row Most Recent Value  Chronic Disease   Chronic disease during today's visit Other  [craniotomy for removal of brain mass]  General Interventions   General Interventions Discussed/Reviewed General Interventions Discussed, Doctor Visits, Referral to Nurse, Communication with  Doctor Visits Discussed/Reviewed Doctor Visits Discussed, Specialist, PCP  PCP/Specialist Visits Compliance with follow-up visit  Communication with PCP/Specialists, RN  Exercise Interventions   Exercise Discussed/Reviewed Exercise Discussed  [confirmed patient "walking" post-recent surgery, "to prevent blood clots after surgery"]  Education Interventions   Education Provided Provided Education  Provided Verbal Education On When to see the doctor, Other, Medication  [process to get PT referral- seeing neurosurgeon this afternoon,  explained referral needs ordered by provider: explained difference between home health and outpatient PT,  education and talking points to cover around blood clot prevention post-surgery]  Nutrition Interventions   Nutrition Discussed/Reviewed Nutrition Discussed  Pharmacy Interventions   Pharmacy Dicussed/Reviewed Pharmacy Topics Discussed  [Full medication review with updating medication list in EHR per patient report]  Safety Interventions   Safety Discussed/Reviewed Safety Discussed  [confirmed patient feels "steady on feet," not requiring/ using assistive devices for mobility]      Caryl Pina, RN, BSN, CCRN  Alumnus RN CM Care Coordination/ Transition of Care- [facility] Care Management [phone]: direct office

## 2022-06-30 ENCOUNTER — Ambulatory Visit (INDEPENDENT_AMBULATORY_CARE_PROVIDER_SITE_OTHER): Payer: BC Managed Care – PPO | Admitting: Physician Assistant

## 2022-06-30 ENCOUNTER — Encounter: Payer: Self-pay | Admitting: Physician Assistant

## 2022-06-30 VITALS — BP 114/72 | HR 112 | Temp 97.8°F | Ht 60.0 in | Wt 133.0 lb

## 2022-06-30 DIAGNOSIS — D496 Neoplasm of unspecified behavior of brain: Secondary | ICD-10-CM

## 2022-06-30 DIAGNOSIS — R000 Tachycardia, unspecified: Secondary | ICD-10-CM

## 2022-06-30 DIAGNOSIS — L309 Dermatitis, unspecified: Secondary | ICD-10-CM | POA: Diagnosis not present

## 2022-06-30 DIAGNOSIS — F418 Other specified anxiety disorders: Secondary | ICD-10-CM

## 2022-06-30 DIAGNOSIS — R899 Unspecified abnormal finding in specimens from other organs, systems and tissues: Secondary | ICD-10-CM

## 2022-06-30 MED ORDER — BUPROPION HCL ER (XL) 150 MG PO TB24
150.0000 mg | ORAL_TABLET | Freq: Every day | ORAL | 2 refills | Status: DC
Start: 2022-06-30 — End: 2022-08-05

## 2022-06-30 MED ORDER — TRIAMCINOLONE ACETONIDE 40 MG/ML IJ SUSP
60.0000 mg | Freq: Once | INTRAMUSCULAR | Status: AC
Start: 2022-06-30 — End: 2022-06-30
  Administered 2022-06-30: 60 mg via INTRAMUSCULAR

## 2022-06-30 MED ORDER — PREDNISONE 20 MG PO TABS
ORAL_TABLET | ORAL | 0 refills | Status: AC
Start: 2022-06-30 — End: 2022-07-08

## 2022-06-30 NOTE — Progress Notes (Signed)
Acute Office Visit  Subjective:    Patient ID: Bonnie Bell, female    DOB: 1985/08/26, 37 y.o.   MRN: [id number]  Chief Complaint  Patient presents with   Rash    HPI Patient is in today for complaints of rash on right ear down her neck.  She states the rash started several days ago and is worsening.  Cannot recall any injury or insect bite -- she did just have brain surgery as well and did not have any iodine or dressing on that area  Pt states that on 3/28 she had a bad headache and passed out.  She was seen at [facility] ED and was found to have a brain tumor - she was then transferred to [place] and had the tumor surgically removed.  The pathology is still pending.  She is following with neurosurgeon Dr Wannetta Sender and has appt 5/10.  She was discharged on Keppra and decadron which she has now completed but as she tapered off the decadron the rash worsened  Pt is noted to have tachycardia today - which looking through her history is new.  She denies chest pain or dyspnea.  She states she feels slightly anxious due to the fact of her recent health issues.  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   Headache     Past Surgical History:  Procedure Laterality Date   surgery on right foot     Cancer    Family History  Problem Relation Age of Onset   Lung cancer Mother    Kidney disease Mother     Social History   Socioeconomic History   Marital status: Married    Spouse name: Kyira Volkert   Number of children: 4   Years of education: Not on file   Highest education level: Not on file  Occupational History   Not on file  Tobacco Use   Smoking status: Never   Smokeless tobacco: Never  Vaping Use   Vaping Use: Never used  Substance and Sexual Activity   Alcohol use: Never   Drug use: Never   Sexual activity: Yes    Partners: Male   Other Topics Concern   Not on file  Social History Narrative   Not on file   Social Determinants of Health   Financial Resource Strain: Low Risk  (08/26/2021)   Overall Financial Resource Strain (CARDIA)    Difficulty of Paying Living Expenses: Not hard at all  Food Insecurity: No Food Insecurity (06/26/2022)   Hunger Vital Sign    Worried About Running Out of Food in the Last Year: Never true    Ran Out of Food in the Last Year: Never true  Transportation Needs: No Transportation Needs (06/26/2022)   PRAPARE - Administrator, Civil Service (Medical): No    Lack of Transportation (Non-Medical): No  Physical Activity: Insufficiently Active (08/26/2021)   Exercise Vital Sign    Days of Exercise per Week: 5 days    Minutes of Exercise per Session: 20 min  Stress: No Stress Concern Present (08/26/2021)   [employer] of Occupational Health - Occupational Stress Questionnaire    Feeling of Stress : Not at all  Social Connections: Moderately Integrated (08/26/2021)   Social Connection and Isolation Panel [NHANES]    Frequency  of Communication with Friends and Family: More than three times a week    Frequency of Social Gatherings with Friends and Family: More than three times a week    Attends Religious Services: More than 4 times per year    Active Member of [employer] or Organizations: No    Attends Banker Meetings: Never    Marital Status: Married  Catering manager Violence: Not At Risk (08/26/2021)   Humiliation, Afraid, Rape, and Kick questionnaire    Fear of Current or Ex-Partner: No    Emotionally Abused: No    Physically Abused: No    Sexually Abused: No     Current Outpatient Medications:    buPROPion (WELLBUTRIN XL) 150 MG 24 hr tablet, Take 1 tablet (150 mg total) by mouth daily., Disp: 30 tablet, Rfl: 2   LORazepam (ATIVAN) 0.5 MG tablet, Take 1 tablet (0.5 mg total) by mouth 2 (two) times daily as needed for anxiety., Disp: 30 tablet, Rfl: 1    ondansetron (ZOFRAN-ODT) 4 MG disintegrating tablet, Take 1 tablet (4 mg total) by mouth every 8 (eight) hours as needed for nausea or vomiting., Disp: 30 tablet, Rfl: 1   predniSONE (DELTASONE) 20 MG tablet, Take 3 tablets (60 mg total) by mouth daily with breakfast for 3 days, THEN 2 tablets (40 mg total) daily with breakfast for 3 days, THEN 1 tablet (20 mg total) daily with breakfast for 3 days., Disp: 18 tablet, Rfl: 0   rizatriptan (MAXALT) 10 MG tablet, Take 1 tablet (10 mg total) by mouth as needed for migraine. May repeat in 2 hours if needed, Disp: 10 tablet, Rfl: 2  Current Facility-Administered Medications:    triamcinolone acetonide (KENALOG-40) injection 60 mg, 60 mg, Intramuscular, Once, Marianne Sofia, PA-C   Allergies  Allergen Reactions   Oxycodone Rash   Topiramate Other (See Comments)    Hands/feet tingling Hands/feet tingling     CONSTITUTIONAL: Negative for chills, fatigue, fever,  E/N/T: Negative for ear pain, nasal congestion and sore throat.  CARDIOVASCULAR: Negative for chest pain, dizziness, palpitations and pedal edema.  RESPIRATORY: Negative for recent cough and dyspnea.  GASTROINTESTINAL: Negative for abdominal pain, acid reflux symptoms, constipation, diarrhea, nausea and vomiting.  MSK: Negative for arthralgias and myalgias.  INTEGUMENTARY: see HPI NEUROLOGICAL: see HPI PSYCHIATRIC: see HPI        Objective:    PHYSICAL EXAM:   VS: BP 114/72 (BP Location: Left Arm, Patient Position: Sitting)   Pulse (!) 112   Temp 97.8 F (36.6 C) (Temporal)   Ht 5' (1.524 m)   Wt 133 lb (60.3 kg)   SpO2 99%   BMI 25.97 kg/m   GEN: Well nourished, well developed, in no acute distress  HEENT: normal external ears and nose - normal external auditory canals and TMS -  - Lips, Teeth and Gums - normal  Cardiac: tachycardia - regular rhythm; no murmurs, rubs, or gallops,no edema - Respiratory:  normal respiratory rate and pattern with no distress - normal breath  sounds with no rales, rhonchi, wheezes or rubs Skin: warm and dry, - craniotomy site healing well --- bright red rash on right ear with mild edema and rash extends down neck - no blisters noted Neuro:  Alert and Oriented x 3, - CN II-Xii grossly intact Psych: euthymic mood, appropriate affect and demeanor  EKG - mild tachycardia otherwise normal Wt Readings from Last 3 Encounters:  06/30/22 133 lb (60.3 kg)  04/29/22 138 lb (62.6 kg)  03/02/22 145  lb (65.8 kg)    There are no preventive care reminders to display for this patient.  There are no preventive care reminders to display for this patient.        Assessment & Plan:   Problem List Items Addressed This Visit   None Visit Diagnoses     Tachycardia    -  Primary   Relevant Orders   EKG 12-Lead   CBC with Differential/Platelet   Comprehensive metabolic panel   T4, free   TSH   Iron, TIBC and Ferritin Panel   Dermatitis       Relevant Medications   predniSONE (DELTASONE) 20 MG tablet Recommend claritin  qd   Brain tumor     Follow up with neurology as directed   Abnormal laboratory test       Relevant Orders   Iron, TIBC and Ferritin Panel   Anxiety with depression       Relevant Medications   buPROPion (WELLBUTRIN XL) 150 MG 24 hr tablet- increased from  po qd        Meds ordered this encounter  Medications   predniSONE (DELTASONE) 20 MG tablet    Sig: Take 3 tablets (60 mg total) by mouth daily with breakfast for 3 days, THEN 2 tablets (40 mg total) daily with breakfast for 3 days, THEN 1 tablet (20 mg total) daily with breakfast for 3 days.    Dispense:  18 tablet    Refill:  0    Order Specific Question:   Supervising Provider    Answer:   Corey Harold   buPROPion (WELLBUTRIN XL) 150 MG 24 hr tablet    Sig: Take 1 tablet (150 mg total) by mouth daily.    Dispense:  30 tablet    Refill:  2    Order Specific Question:   Supervising Provider    Answer:   Corey Harold    triamcinolone acetonide (KENALOG-40) injection 60 mg   Follow up in one month  SARA R Jeziel Hoffmann, PA-C

## 2022-07-01 LAB — COMPREHENSIVE METABOLIC PANEL
ALT: 14 IU/L (ref 0–32)
AST: 15 IU/L (ref 0–40)
Albumin/Globulin Ratio: 1.9 (ref 1.2–2.2)
Albumin: 4.1 g/dL (ref 3.9–4.9)
Alkaline Phosphatase: 74 IU/L (ref 44–121)
BUN/Creatinine Ratio: 21 (ref 9–23)
BUN: 12 mg/dL (ref 6–20)
Bilirubin Total: <0.2 mg/dL (ref 0.0–1.2)
CO2: 23 mmol/L (ref 20–29)
Calcium: 9.3 mg/dL (ref 8.7–10.2)
Chloride: 103 mmol/L (ref 96–106)
Creatinine, Ser: 0.56 mg/dL — ABNORMAL LOW (ref 0.57–1.00)
Globulin, Total: 2.2 g/dL (ref 1.5–4.5)
Glucose: 87 mg/dL (ref 70–99)
Potassium: 4.5 mmol/L (ref 3.5–5.2)
Sodium: 141 mmol/L (ref 134–144)
Total Protein: 6.3 g/dL (ref 6.0–8.5)
eGFR: 123 mL/min/{1.73_m2} (ref >59)

## 2022-07-01 LAB — CBC WITH DIFFERENTIAL/PLATELET
Basophils Absolute: 0 10*3/uL (ref 0.0–0.2)
Basos: 0 %
EOS (ABSOLUTE): 0.3 10*3/uL (ref 0.0–0.4)
Eos: 3 %
Hematocrit: 41.7 % (ref 34.0–46.6)
Hemoglobin: 13.3 g/dL (ref 11.1–15.9)
Immature Grans (Abs): 0.2 10*3/uL — ABNORMAL HIGH (ref 0.0–0.1)
Immature Granulocytes: 2 %
Lymphocytes Absolute: 2.1 10*3/uL (ref 0.7–3.1)
Lymphs: 22 %
MCH: 28.9 pg (ref 26.6–33.0)
MCHC: 31.9 g/dL (ref 31.5–35.7)
MCV: 91 fL (ref 79–97)
Monocytes Absolute: 0.8 10*3/uL (ref 0.1–0.9)
Monocytes: 8 %
Neutrophils Absolute: 6 10*3/uL (ref 1.4–7.0)
Neutrophils: 65 %
Platelets: 241 10*3/uL (ref 150–450)
RBC: 4.6 x10E6/uL (ref 3.77–5.28)
RDW: 13.1 % (ref 11.7–15.4)
WBC: 9.3 10*3/uL (ref 3.4–10.8)

## 2022-07-01 LAB — IRON,TIBC AND FERRITIN PANEL
Ferritin: 127 ng/mL (ref 15–150)
Iron Saturation: 19 % (ref 15–55)
Iron: 56 ug/dL (ref 27–159)
Total Iron Binding Capacity: 295 ug/dL (ref 250–450)
UIBC: 239 ug/dL (ref 131–425)

## 2022-07-01 LAB — T4, FREE: Free T4: 1.4 ng/dL (ref 0.82–1.77)

## 2022-07-01 LAB — TSH: TSH: 0.891 u[IU]/mL (ref 0.450–4.500)

## 2022-07-02 ENCOUNTER — Ambulatory Visit: Payer: Self-pay

## 2022-07-02 NOTE — Patient Outreach (Signed)
  Care Coordination   Follow Up Visit Note   07/02/2022 Name: Bonnie Bell MRN: [id number] DOB: [birth year]  Bonnie Bell is a 37 y.o. year old female who sees Bonnie Bell, [place] for primary care. I spoke with  Bonnie Bell by phone today.  What matters to the patients health and wellness today?  Follow up call with patient today. Patient reports that she is doing well. Reports saw PCP this week due to an allergic reactions. Reports she is much better, Patient reports she had a follow up with surgeon and has no concerns at this time. Patient reports each day gets better and better. Reports she is able to be up and about.  Encouraged patient to reach out to MD if she needs assistance in the future. She agreed.      SDOH assessments and interventions completed:  No     Care Coordination Interventions:  No, not indicated   Follow up plan: No further intervention required.   Encounter Outcome:  Pt. Visit Completed   Rowe Pavy, RN, BSN, CEN [facility] Coordinator [phone]

## 2022-07-24 DIAGNOSIS — D496 Neoplasm of unspecified behavior of brain: Secondary | ICD-10-CM | POA: Diagnosis not present

## 2022-07-24 DIAGNOSIS — Z09 Encounter for follow-up examination after completed treatment for conditions other than malignant neoplasm: Secondary | ICD-10-CM | POA: Diagnosis not present

## 2022-08-03 ENCOUNTER — Ambulatory Visit: Payer: BC Managed Care – PPO | Admitting: Physician Assistant

## 2022-08-05 ENCOUNTER — Ambulatory Visit (INDEPENDENT_AMBULATORY_CARE_PROVIDER_SITE_OTHER): Payer: BC Managed Care – PPO | Admitting: Physician Assistant

## 2022-08-05 ENCOUNTER — Encounter: Payer: Self-pay | Admitting: Physician Assistant

## 2022-08-05 VITALS — BP 108/70 | HR 93 | Temp 97.4°F | Ht 60.0 in | Wt 128.0 lb

## 2022-08-05 DIAGNOSIS — R899 Unspecified abnormal finding in specimens from other organs, systems and tissues: Secondary | ICD-10-CM | POA: Diagnosis not present

## 2022-08-05 DIAGNOSIS — D496 Neoplasm of unspecified behavior of brain: Secondary | ICD-10-CM

## 2022-08-05 DIAGNOSIS — F4321 Adjustment disorder with depressed mood: Secondary | ICD-10-CM | POA: Diagnosis not present

## 2022-08-05 DIAGNOSIS — Z634 Disappearance and death of family member: Secondary | ICD-10-CM

## 2022-08-05 DIAGNOSIS — F418 Other specified anxiety disorders: Secondary | ICD-10-CM

## 2022-08-05 DIAGNOSIS — F411 Generalized anxiety disorder: Secondary | ICD-10-CM

## 2022-08-05 LAB — CBC WITH DIFFERENTIAL/PLATELET
Basophils Absolute: 0.1 10*3/uL (ref 0.0–0.2)
Basos: 1 %
EOS (ABSOLUTE): 0.1 10*3/uL (ref 0.0–0.4)
Eos: 1 %
Hematocrit: 42.7 % (ref 34.0–46.6)
Hemoglobin: 13.8 g/dL (ref 11.1–15.9)
Immature Grans (Abs): 0 10*3/uL (ref 0.0–0.1)
Immature Granulocytes: 0 %
Lymphocytes Absolute: 1.9 10*3/uL (ref 0.7–3.1)
Lymphs: 20 %
MCH: 29.2 pg (ref 26.6–33.0)
MCHC: 32.3 g/dL (ref 31.5–35.7)
MCV: 91 fL (ref 79–97)
Monocytes Absolute: 0.6 10*3/uL (ref 0.1–0.9)
Monocytes: 6 %
Neutrophils Absolute: 6.9 10*3/uL (ref 1.4–7.0)
Neutrophils: 72 %
Platelets: 255 10*3/uL (ref 150–450)
RBC: 4.72 x10E6/uL (ref 3.77–5.28)
RDW: 13.3 % (ref 11.7–15.4)
WBC: 9.6 10*3/uL (ref 3.4–10.8)

## 2022-08-05 MED ORDER — LORAZEPAM 0.5 MG PO TABS
0.5000 mg | ORAL_TABLET | Freq: Two times a day (BID) | ORAL | 1 refills | Status: DC | PRN
Start: 2022-08-05 — End: 2022-12-22

## 2022-08-05 MED ORDER — VENLAFAXINE HCL 75 MG PO TABS
75.0000 mg | ORAL_TABLET | Freq: Two times a day (BID) | ORAL | 1 refills | Status: DC
Start: 2022-08-05 — End: 2022-08-12

## 2022-08-05 NOTE — Progress Notes (Signed)
Subjective:  Patient ID: Bonnie Bell, female    DOB: Jun 03, 1985  Age: 37 y.o. MRN: [id number]  Chief Complaint  Patient presents with   Anxiety   Depression    HPI Pt in today for follow up of anxiety/depression.  She had been on wellbutrin 75mg  qd and felt she needed increase dose - she was switched to 150mg  XR and did not tolerate.  She also had a rx of ativan which she had not been using but now is using as needed.  She would like to restart wellbutrin 75mg  but once daily then titrate to bid Pt gives history today of why depression medication was initially started - she states on [birth year] (her birthday) she lost her 56 month old son (who was identical twin) due to being wedged between mattress/bed sleeping with her and husband.  Although she still grieves she is stable at this time  Pt was diagnosed recently with a brain tumor - she is still following with neurology and will see them again for repeat MRI and follow up in 7/24.  She was told the lesion was a meningioma and non cancerous  Pt had slightly abnormal cbc at last visit - due to recheck     08/05/2022    2:29 PM 03/02/2022    8:13 AM 12/24/2021    2:54 PM 02/14/2021   10:48 AM  Depression screen PHQ 2/9  Decreased Interest 1 1 2  0  Down, Depressed, Hopeless 0 0 2 0  PHQ - 2 Score 1 1 4  0  Altered sleeping 0 2 3 1   Tired, decreased energy 0 2 2 1   Change in appetite 0 0 2 0  Feeling bad or failure about yourself  0 0 0 0  Trouble concentrating 1 1 1  0  Moving slowly or fidgety/restless 0 1 1 0  Suicidal thoughts 0 0 0 0  PHQ-9 Score 2 7 13 2   Difficult doing work/chores Not difficult at all Not difficult at all Not difficult at all Not difficult at all        08/26/2021   11:05 AM 12/24/2021    2:54 PM 03/02/2022    8:13 AM 04/29/2022    1:25 PM 08/05/2022    2:29 PM  Fall Risk  Falls in the past year? 0 0 0 0 0  Was there an injury with Fall? 0 0 0 0 0  Fall Risk Category Calculator 0 0 0 0 0  Fall Risk  Category (Retired) Low Low Low    (RETIRED) Patient Fall Risk Level Low fall risk Low fall risk Low fall risk    Patient at Risk for Falls Due to No Fall Risks No Fall Risks No Fall Risks No Fall Risks No Fall Risks  Fall risk Follow up Falls evaluation completed Falls evaluation completed Falls evaluation completed Falls evaluation completed Falls evaluation completed     ROS CONSTITUTIONAL: Negative for chills, fatigue, fever,   CARDIOVASCULAR: Negative for chest pain, dizziness, palpitations and pedal edema.  RESPIRATORY: Negative for recent cough and dyspnea.  GASTROINTESTINAL: Negative for abdominal pain, acid reflux symptoms, constipation, diarrhea, nausea and vomiting.   NEUROLOGICAL: see HPI PSYCHIATRIC: see HPI   Current Outpatient Medications:    ondansetron (ZOFRAN-ODT) 4 MG disintegrating tablet, Take 1 tablet (4 mg total) by mouth every 8 (eight) hours as needed for nausea or vomiting., Disp: 30 tablet, Rfl: 1   rizatriptan (MAXALT) 10 MG tablet, Take 1 tablet (10 mg total) by mouth as needed  for migraine. 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   Headache    Objective:  PHYSICAL EXAM:   BP 108/70 (BP Location: Left Arm, Patient Position: Sitting, Cuff Size: Normal)   Pulse 93   Temp (!) 97.4 F (36.3 C) (Temporal)   Ht 5' (1.524 m)   Wt 128 lb (58.1 kg)   SpO2 98%   BMI 25.00 kg/m    GEN: Well nourished, well developed, in no acute distress  Cardiac: RRR; no murmurs, rubs, or gallops,no edema -  Respiratory:  normal respiratory rate and pattern with no distress - normal breath sounds with no rales,  rhonchi, wheezes or rubs Skin: warm and dry, no rash  Neuro:  Alert and Oriented x 3,- CN II-Xii grossly intact Psych: euthymic mood, appropriate affect and demeanor  Assessment & Plan:    Brain tumor [facility]) Continue follow up with neurology Abnormal laboratory test result -     CBC with Differential/Platelet  Anxiety with depression  Continue ativan as needed Rx for effexor 75mg  1 po bid  Follow-up: Return in about 3 months (around 11/05/2022) for follow-up.  An After Visit Summary was printed and given to the patient.  Jettie Pagan [facility] [phone]

## 2022-08-12 ENCOUNTER — Other Ambulatory Visit: Payer: Self-pay | Admitting: Physician Assistant

## 2022-08-12 ENCOUNTER — Encounter: Payer: Self-pay | Admitting: Physician Assistant

## 2022-08-12 MED ORDER — ESCITALOPRAM OXALATE 10 MG PO TABS: 10.0000 mg | ORAL_TABLET | Freq: Every day | ORAL | 1 refills | Status: DC

## 2022-08-31 ENCOUNTER — Encounter: Payer: Self-pay | Admitting: Physician Assistant

## 2022-08-31 ENCOUNTER — Ambulatory Visit (INDEPENDENT_AMBULATORY_CARE_PROVIDER_SITE_OTHER): Payer: BC Managed Care – PPO | Admitting: Physician Assistant

## 2022-08-31 VITALS — BP 106/70 | HR 90 | Temp 97.2°F | Ht 60.0 in | Wt 126.2 lb

## 2022-08-31 DIAGNOSIS — J069 Acute upper respiratory infection, unspecified: Secondary | ICD-10-CM

## 2022-08-31 LAB — POC COVID19 BINAXNOW: SARS Coronavirus 2 Ag: NEGATIVE

## 2022-08-31 MED ORDER — AZITHROMYCIN 250 MG PO TABS
ORAL_TABLET | ORAL | 0 refills | Status: AC
Start: 2022-08-31 — End: 2022-09-05

## 2022-08-31 MED ORDER — PROMETHAZINE-DM 6.25-15 MG/5ML PO SYRP
5.0000 mL | ORAL_SOLUTION | Freq: Four times a day (QID) | ORAL | 0 refills | Status: DC | PRN
Start: 2022-08-31 — End: 2022-10-20

## 2022-08-31 NOTE — Progress Notes (Signed)
   Acute Office Visit  Subjective:    Patient ID: Bonnie Bell, female    DOB: 03/16/1986, 37 y.o.   MRN: [id number]  Chief Complaint  Patient presents with   Nasal Congestion   Cough    HPI: Patient is in today for complaints of cough, congestion and ears stopped up since last week.  She has had some malaise but no fever.  Appetite normal - has been taking otc meds with minimal improvement Cough is productive at times  Current Outpatient Medications:    azithromycin (ZITHROMAX) 250 MG tablet, Take 2 tablets on day 1, then 1 tablet daily on days 2 through 5, Disp: 6 tablet, Rfl: 0   escitalopram (LEXAPRO) 10 MG tablet, Take 1 tablet (10 mg total) by mouth daily., Disp: 30 tablet, Rfl: 1   LORazepam (ATIVAN) 0.5 MG tablet, Take 1 tablet (0.5 mg total) by mouth 2 (two) times daily as needed for anxiety., Disp: 60 tablet, Rfl: 1   ondansetron (ZOFRAN-ODT) 4 MG disintegrating tablet, Take 1 tablet (4 mg total) by mouth every 8 (eight) hours as needed for nausea or vomiting., Disp: 30 tablet, Rfl: 1   promethazine-dextromethorphan (PROMETHAZINE-DM) 6.25-15 MG/5ML syrup, Take 5 mLs by mouth 4 (four) times daily as needed., Disp: 118 mL, Rfl: 0   rizatriptan (MAXALT) 10 MG tablet, Take 1 tablet (10 mg total) by mouth as needed for migraine. May repeat in 2 hours if needed, Disp: 10 tablet, Rfl: 2  Allergies  Allergen Reactions   Oxycodone Rash   Topiramate Other (See Comments)    Hands/feet tingling Hands/feet tingling     ROS CONSTITUTIONAL: see HPI E/N/T: see HPI CARDIOVASCULAR: Negative for chest pain, dizziness, palpitations and pedal edema.  RESPIRATORY: see HPI GASTROINTESTINAL: Negative for abdominal pain, acid reflux symptoms, constipation, diarrhea, nausea and vomiting.       Objective:    PHYSICAL EXAM:   BP 106/70 (BP Location: Left Arm, Patient Position: Sitting, Cuff Size: Normal)   Pulse 90   Temp (!) 97.2 F (36.2 C) (Temporal)   Ht 5' (1.524 m)   Wt 126 lb  3.2 oz (57.2 kg)   SpO2 97%   BMI 24.65 kg/m    GEN: Well nourished, well developed, in no acute distress  HEENT: normal external ears and nose - normal external auditory canals and TMS -  - Lips, Teeth and Gums - normal  Oropharynx - erythema/pnd Cardiac: RRR; no murmurs Respiratory:  faint scattered rhonchi that clears with cough  Office Visit on 08/31/2022  Component Date Value Ref Range Status   SARS Coronavirus 2 Ag 08/31/2022 Negative  Negative Final        Assessment & Plan:    Acute upper respiratory infection -     POC COVID-19 BinaxNow -     Azithromycin; Take 2 tablets on day 1, then 1 tablet daily on days 2 through 5  Dispense: 6 tablet; Refill: 0 -     Promethazine-DM; Take 5 mLs by mouth 4 (four) times daily as needed.  Dispense: 118 mL; Refill: 0     Follow-up: Return if symptoms worsen or fail to improve.  An After Visit Summary was printed and given to the patient.  Jettie Pagan [facility] [phone]

## 2022-10-05 DIAGNOSIS — G9389 Other specified disorders of brain: Secondary | ICD-10-CM | POA: Diagnosis not present

## 2022-10-05 DIAGNOSIS — R220 Localized swelling, mass and lump, head: Secondary | ICD-10-CM | POA: Diagnosis not present

## 2022-10-05 DIAGNOSIS — D496 Neoplasm of unspecified behavior of brain: Secondary | ICD-10-CM | POA: Diagnosis not present

## 2022-10-20 ENCOUNTER — Encounter: Payer: Self-pay | Admitting: Physician Assistant

## 2022-10-20 ENCOUNTER — Ambulatory Visit: Payer: BC Managed Care – PPO | Admitting: Physician Assistant

## 2022-10-20 VITALS — BP 102/68 | HR 76 | Temp 97.5°F | Ht 60.0 in | Wt 124.0 lb

## 2022-10-20 DIAGNOSIS — F418 Other specified anxiety disorders: Secondary | ICD-10-CM | POA: Diagnosis not present

## 2022-10-20 DIAGNOSIS — H6502 Acute serous otitis media, left ear: Secondary | ICD-10-CM

## 2022-10-20 DIAGNOSIS — H6993 Unspecified Eustachian tube disorder, bilateral: Secondary | ICD-10-CM | POA: Diagnosis not present

## 2022-10-20 MED ORDER — AMOXICILLIN 875 MG PO TABS
875.0000 mg | ORAL_TABLET | Freq: Two times a day (BID) | ORAL | 0 refills | Status: AC
Start: 2022-10-20 — End: 2022-10-30

## 2022-10-20 MED ORDER — FLUTICASONE PROPIONATE 50 MCG/ACT NA SUSP
2.0000 | Freq: Every day | NASAL | 6 refills | Status: AC
Start: 2022-10-20 — End: ?

## 2022-10-20 MED ORDER — ESCITALOPRAM OXALATE 20 MG PO TABS
20.0000 mg | ORAL_TABLET | Freq: Every day | ORAL | 1 refills | Status: DC
Start: 2022-10-20 — End: 2023-12-30

## 2022-10-20 NOTE — Progress Notes (Signed)
Acute Office Visit  Subjective:    Patient ID: Bonnie Bell, female    DOB: 12-Jan-1986, 37 y.o.   MRN: [id number]  Chief Complaint  Patient presents with   Ear Pain    HPI: Patient is in today for complaints of both ears popping for the past few weeks but [place] symptoms [place] - now is having pain in left ear as well - denies fever or congestion  Pt is having some breakthrough anxiety mostly with doctor appts.  She did recently have repeat brain MRI and it was normal - no recurrence of brain mass.  She likes the lexapro and thinks she could benefit from increased dose.  Uses ativan as needed   Current Outpatient Medications:    amoxicillin (AMOXIL) 875 MG tablet, Take 1 tablet (875 mg total) by mouth 2 (two) times daily for 10 days., Disp: 20 tablet, Rfl: 0   escitalopram (LEXAPRO) 20 MG tablet, Take 1 tablet (20 mg total) by mouth daily., Disp: 90 tablet, Rfl: 1   fluticasone (FLONASE) 50 MCG/ACT nasal spray, Place 2 sprays into both nostrils daily., Disp: 16 g, Rfl: 6   LORazepam (ATIVAN) 0.5 MG tablet, Take 1 tablet (0.5 mg total) by mouth 2 (two) times daily as needed for anxiety., Disp: 60 tablet, Rfl: 1   ondansetron (ZOFRAN-ODT) 4 MG disintegrating tablet, Take 1 tablet (4 mg total) by mouth every 8 (eight) hours as needed for nausea or vomiting., Disp: 30 tablet, Rfl: 1   rizatriptan (MAXALT) 10 MG tablet, Take 1 tablet (10 mg total) by mouth as needed for migraine. May repeat in 2 hours if needed, Disp: 10 tablet, Rfl: 2  Allergies  Allergen Reactions   Oxycodone Rash   Topiramate Other (See Comments)    Hands/feet tingling Hands/feet tingling     ROS CONSTITUTIONAL: Negative for chills, fatigue, fever,  E/N/T: see HPI CARDIOVASCULAR: Negative for chest pain,  RESPIRATORY: Negative for recent cough and dyspnea.   PSYCHIATRIC:see HPI     Objective:    PHYSICAL EXAM:   BP 102/68 (BP Location: Left Arm, Patient Position: Sitting,  Cuff Size: Normal)   Pulse 76   Temp (!) 97.5 F (36.4 C) (Temporal)   Ht 5' (1.524 m)   Wt 124 lb (56.2 kg)   SpO2 98%   BMI 24.22 kg/m    GEN: Well nourished, well developed, in no acute distress  ENT -- both tms retracted - left TM red and dull Cardiac: RRR; no murmurs,  Respiratory:  normal respiratory rate and pattern with no distress - normal breath sounds with no rales, rhonchi, wheezes or rubs Psych: euthymic mood, appropriate affect and demeanor     Assessment & Plan:    Non-recurrent acute serous otitis media of left ear -     Amoxicillin; Take 1 tablet (875 mg total) by mouth 2 (two) times daily for 10 days.  Dispense: 20 tablet; Refill: 0 -     Fluticasone Propionate; Place 2 sprays into both nostrils daily.  Dispense: 16 g; Refill: 6  Anxiety with depression -     Escitalopram Oxalate; Take 1 tablet (20 mg total) by mouth daily.  Dispense: 90 tablet; Refill: 1 - increased from 10mg  qd  Dysfunction of both eustachian tubes Warm salt water gargles    Follow-up: Return in about 2 months (around 12/20/2022) for follow-up.  An After Visit Summary was printed and given to the patient.  SARA R Darshan Solanki, PA-C [facility] [phone]

## 2022-10-22 ENCOUNTER — Encounter: Payer: Self-pay | Admitting: Physician Assistant

## 2022-11-10 ENCOUNTER — Ambulatory Visit: Payer: BC Managed Care – PPO | Admitting: Physician Assistant

## 2022-11-27 DIAGNOSIS — D496 Neoplasm of unspecified behavior of brain: Secondary | ICD-10-CM | POA: Diagnosis not present

## 2022-11-27 DIAGNOSIS — G9389 Other specified disorders of brain: Secondary | ICD-10-CM | POA: Diagnosis not present

## 2022-12-08 ENCOUNTER — Ambulatory Visit: Payer: BC Managed Care – PPO | Admitting: Physician Assistant

## 2022-12-16 ENCOUNTER — Ambulatory Visit: Payer: BC Managed Care – PPO | Admitting: Physician Assistant

## 2022-12-16 DIAGNOSIS — F4381 Prolonged grief disorder: Secondary | ICD-10-CM | POA: Diagnosis not present

## 2022-12-16 DIAGNOSIS — F4310 Post-traumatic stress disorder, unspecified: Secondary | ICD-10-CM | POA: Diagnosis not present

## 2022-12-22 ENCOUNTER — Encounter: Payer: Self-pay | Admitting: Physician Assistant

## 2022-12-22 ENCOUNTER — Ambulatory Visit: Payer: BC Managed Care – PPO | Admitting: Physician Assistant

## 2022-12-22 VITALS — BP 100/68 | HR 67 | Temp 97.3°F | Ht 60.0 in | Wt 125.4 lb

## 2022-12-22 DIAGNOSIS — F418 Other specified anxiety disorders: Secondary | ICD-10-CM | POA: Diagnosis not present

## 2022-12-22 DIAGNOSIS — D496 Neoplasm of unspecified behavior of brain: Secondary | ICD-10-CM

## 2022-12-22 NOTE — Progress Notes (Signed)
Subjective:  Patient ID: Bonnie Bell, female    DOB: 1985/07/15  Age: 37 y.o. MRN: [id number]  Chief Complaint  Patient presents with   Medical Management of Chronic Issues    HPI Pt in today for follow up of anxiety with depression - she states overall the lexapro 20mg  is working very well for her and not having any problems with medication.  It has helped her with feeling overwhelmed, anxious etc  Pt did have follow up brain scan recently which showed no new changes.  She continues to follow with neurology every few months for follow up of her brain tumor (had resection of grade 2 meningioma) She states since having that done she also does not have hardly any more headaches     12/22/2022    8:27 AM 10/20/2022    9:56 AM 08/05/2022    2:29 PM 03/02/2022    8:13 AM 12/24/2021    2:54 PM  Depression screen PHQ 2/9  Decreased Interest 0 1 1 1 2   Down, Depressed, Hopeless 0 3 0 0 2  PHQ - 2 Score 0 4 1 1 4   Altered sleeping 0 3 0 2 3  Tired, decreased energy 0 2 0 2 2  Change in appetite  3 0 0 2  Feeling bad or failure about yourself  0 3 0 0 0  Trouble concentrating 0 3 1 1 1   Moving slowly or fidgety/restless 0 3 0 1 1  Suicidal thoughts 0 3 0 0 0  PHQ-9 Score 0 24 2 7 13   Difficult doing work/chores Not difficult at all  Not difficult at all Not difficult at all Not difficult at all        03/02/2022    8:13 AM 04/29/2022    1:25 PM 08/05/2022    2:29 PM 10/20/2022    9:56 AM 12/22/2022    8:26 AM  Fall Risk  Falls in the past year? 0 0 0 0 0  Was there an injury with Fall? 0 0 0 0 0  Fall Risk Category Calculator 0 0 0 0 0  Fall Risk Category (Retired) Low      (RETIRED) Patient Fall Risk Level Low fall risk      Patient at Risk for Falls Due to No Fall Risks No Fall Risks No Fall Risks No Fall Risks No Fall Risks  Fall risk Follow up Falls evaluation completed Falls evaluation completed Falls evaluation completed Falls evaluation completed Falls evaluation completed      ROS CONSTITUTIONAL: Negative for chills, fatigue, fever,   CARDIOVASCULAR: Negative for chest pain, dizziness,  RESPIRATORY: Negative for recent cough and dyspnea.   NEUROLOGICAL: Negative for dizziness and headaches.  PSYCHIATRIC: Negative for sleep disturbance and to question depression screen.  Negative for depression, negative for anhedonia.    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   Headache    Objective:  PHYSICAL EXAM:   BP 100/68 (BP Location: Left Arm,  Patient Position: Sitting, Cuff Size: Normal)   Pulse 67   Temp (!) 97.3 F (36.3 C) (Temporal)   Ht 5' (1.524 m)   Wt 125 lb 6.4 oz (56.9 kg)   LMP 12/20/2022 (Exact Date)   SpO2 100%   BMI 24.49 kg/m    GEN: Well nourished, well developed, in no acute distress   Cardiac: RRR; no murmurs, rubs,  Respiratory:  normal respiratory rate and pattern with no distress - normal breath sounds with no rales, rhonchi, wheezes or rubs  Psych: euthymic mood, appropriate affect and demeanor  Assessment & Plan:    Anxiety with depression Continue lexapro 20mg  qd Brain tumor (HCC) Follow up with neurology as directed    Follow-up: Return in about 6 months (around 06/22/2023) for fasting physical - 20 min - no pap.  An After Visit Summary was printed and given to the patient.  Jettie Pagan [facility] [phone]

## 2022-12-25 DIAGNOSIS — F4310 Post-traumatic stress disorder, unspecified: Secondary | ICD-10-CM | POA: Diagnosis not present

## 2022-12-25 DIAGNOSIS — F4381 Prolonged grief disorder: Secondary | ICD-10-CM | POA: Diagnosis not present

## 2022-12-26 IMAGING — US US MFM OB FOLLOW-UP EACH ADDL GEST (MODIFY)
1 series · 12 of 28 positions shown · non-contrast
Comparison: none

[Series 1: us mfm ob follow-up each addl gest (modify) · 12 of 30 slices shown]
[im 2/30]
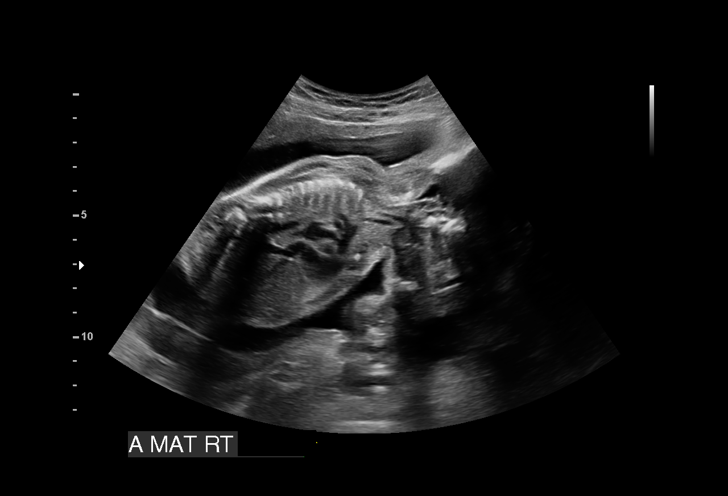
[im 4/30]
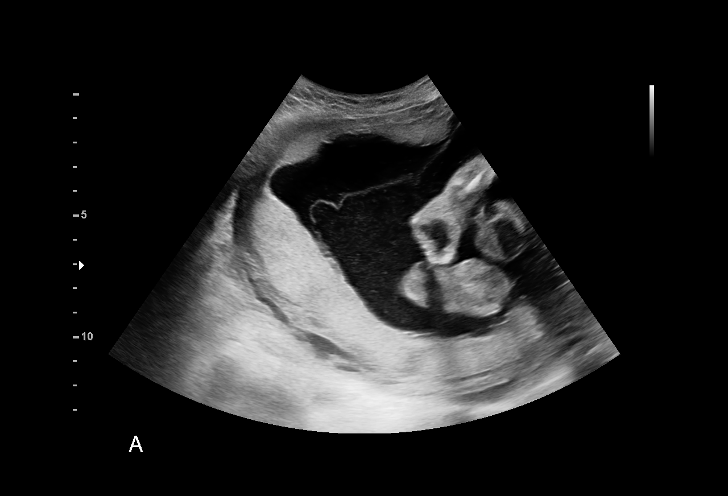
[im 6/30]
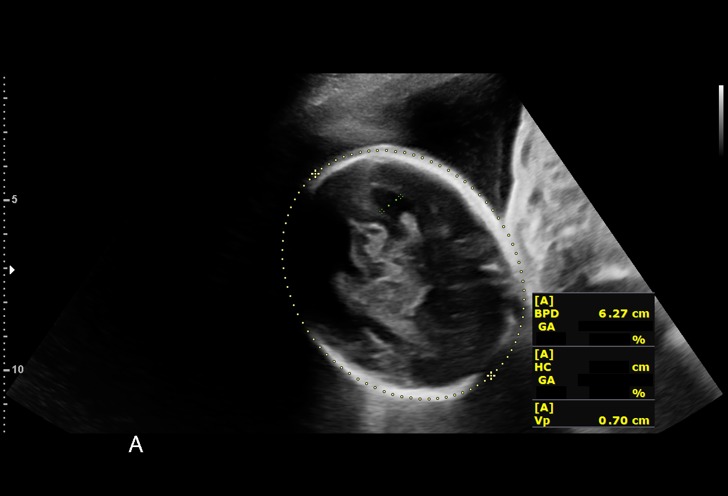
[im 9/30]
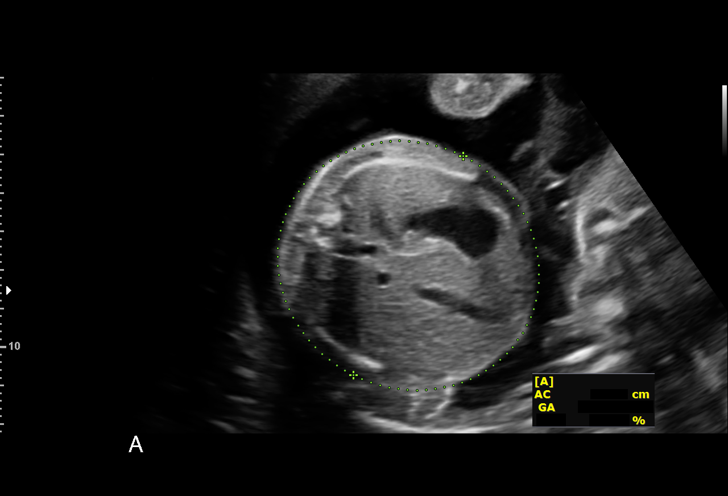
[im 11/30]
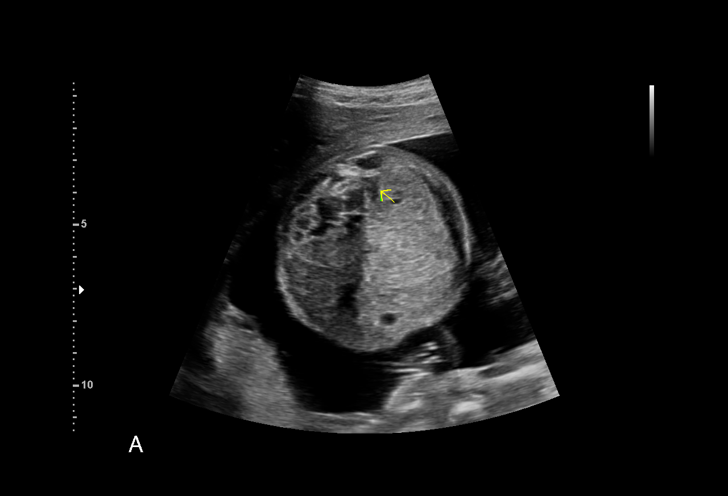
[im 13/30]
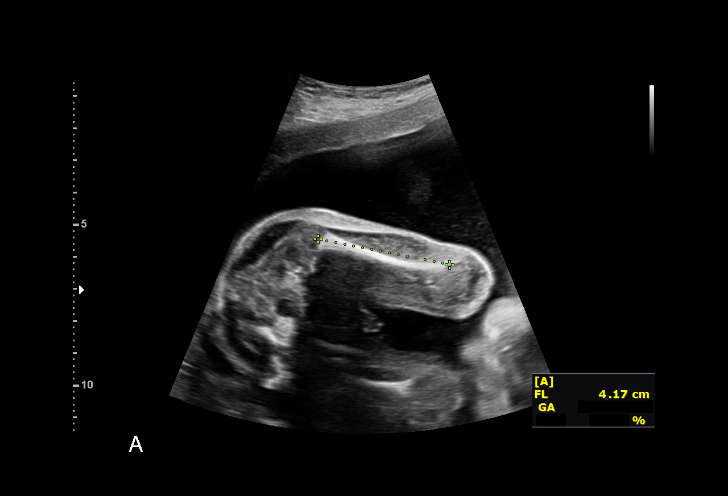
[im 17/30]
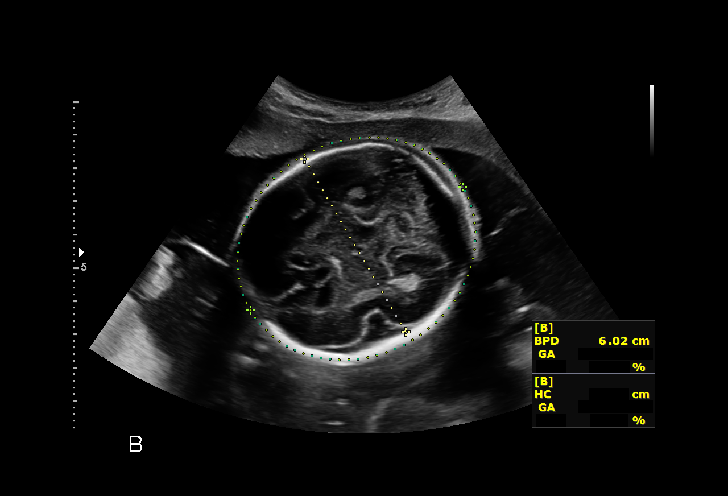
[im 19/30]
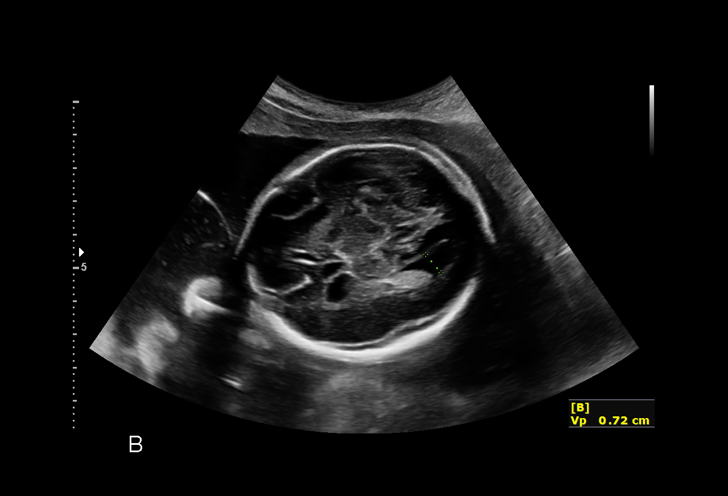
[im 21/30]
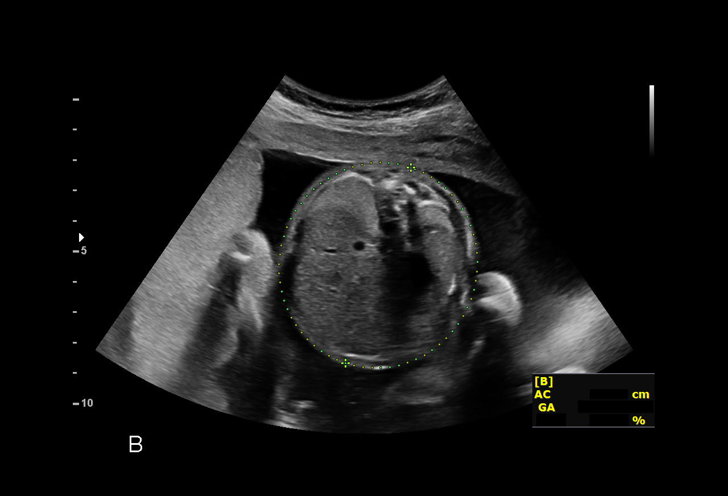
[im 24/30]
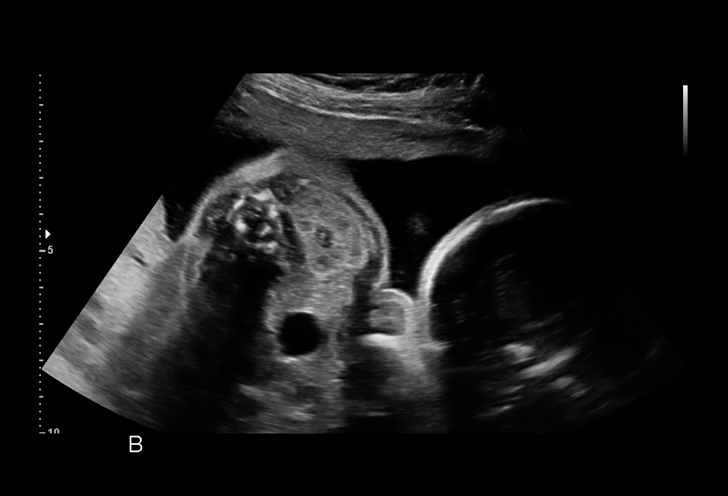
[im 26/30]
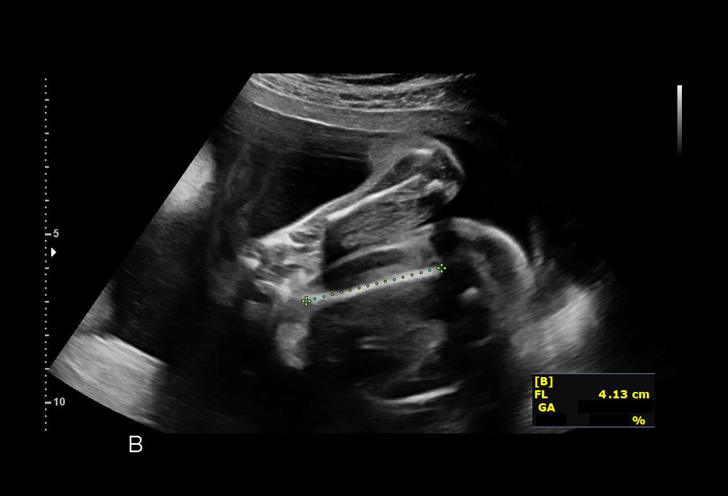
[im 28/30]
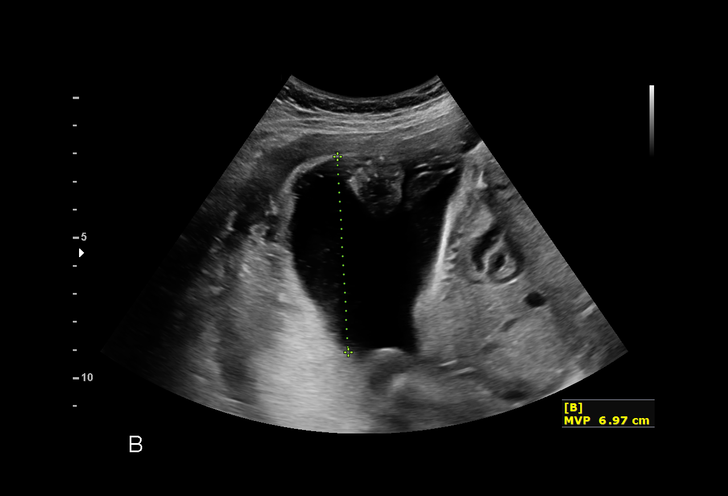

[12 of 28 positions shown; findings below may reference images not displayed]

GEST

Indications

 24 weeks gestation of pregnancy
 Twin pregnancy, [place]/Casimir, second trimester
 Marginal insertion of umbilical cord affecting
 management of mother in second trimester
 (Twin B)
 Low Risk NIPS(Negative AFP)
 Rh negative state in antepartum (O negative)
Fetal Evaluation (Fetus A)

 Num Of Fetuses:         2
 Fetal Heart Rate(bpm):  133
 Cardiac Activity:       Observed
 Fetal Lie:              Maternal right side
 Presentation:           Cephalic
 Placenta:               Posterior
 P. Cord Insertion:      Previously Visualized
 Membrane Desc:      Dividing Membrane seen - Monochorionic

 Amniotic Fluid
 AFI FV:      Within normal limits

                             Largest Pocket(cm)

Biometry (Fetus A)

 BPD:      62.8  mm     G. Age:  25w 3d         86  %    CI:        77.82   %    70 - 86
                                                         FL/HC:      18.6   %    18.7 -
 HC:      225.3  mm     G. Age:  24w 4d         47  %    HC/AC:      1.10        1.05 -
 AC:      205.3  mm     G. Age:  25w 1d         72  %    FL/BPD:     66.9   %    71 - 87
 FL:         42  mm     G. Age:  23w 5d         23  %    FL/AC:      20.5   %    20 - 24

 LV:          7  mm

 Est. FW:     709  gm      1 lb 9 oz     61  %     FW Discordancy      0 \ 1 %
OB History

 Gravidity:    3         Term:   2
Gestational Age (Fetus A)

 LMP:           24w 1d        Date:  08/06/20                 EDD:   05/13/21
 U/S Today:     24w 5d                                        EDD:   05/09/21
 Best:          24w 1d     Det. By:  LMP  (08/06/20)          EDD:   05/13/21
Anatomy (Fetus A)

 Cranium:               Appears normal         LVOT:                   Previously seen
 Cavum:                 Previously seen        Aortic Arch:            Previously seen
 Ventricles:            Appears normal         Ductal Arch:            Previously seen
 Choroid Plexus:        Previously seen        Diaphragm:              Previously seen
 Cerebellum:            Previously seen        Stomach:                Appears normal, left
                                                                       sided
 Posterior Fossa:       Previously seen        Abdomen:                Appears normal
 Nuchal Fold:           Previously seen        Abdominal Wall:         Previously seen
 Face:                  Orbits and profile     Cord Vessels:           Previously seen
                        previously seen
 Lips:                  Previously seen        Kidneys:                Appear normal
 Palate:                Previously seen        Bladder:                Appears normal
 Thoracic:              Previously seen        Spine:                  Previously seen
 Heart:                 Appears normal         Upper Extremities:      Previously seen
                        (4CH, axis, and
                        situs)
 RVOT:                  Previously seen        Lower Extremities:      Previously seen

 Other:  Male gender previously seen. Heels/feet, open hands/5th digits, nasal
         bone, lenses, VC, 3VV, 3VT previously visualized.

Fetal Evaluation (Fetus B)

 Num Of Fetuses:         2
 Fetal Heart Rate(bpm):  135
 Cardiac Activity:       Observed
 Fetal Lie:              Maternal left side
 Presentation:           Breech
 Placenta:               Posterior
 P. Cord Insertion:      Previously Visualized
 Membrane Desc:      Dividing Membrane seen - Monochorionic
 Amniotic Fluid
 AFI FV:      Within normal limits

                             Largest Pocket(cm)

Biometry (Fetus B)

 BPD:        60  mm     G. Age:  24w 3d         55  %    CI:        73.77   %    70 - 86
                                                         FL/HC:      18.5   %    18.7 -
 HC:      221.9  mm     G. Age:  24w 2d         34  %    HC/AC:      1.06        1.05 -
 AC:      208.4  mm     G. Age:  25w 3d         79  %    FL/BPD:     68.5   %    71 - 87
 FL:       41.1  mm     G. Age:  23w 2d         15  %    FL/AC:      19.7   %    20 - 24

 LV:        7.2  mm

 Est. FW:     702  gm      1 lb 9 oz     58  %     FW Discordancy         1  %
Gestational Age (Fetus B)

 LMP:           24w 1d        Date:  08/06/20                 EDD:   05/13/21
 U/S Today:     24w 3d                                        EDD:   05/11/21
 Best:          24w 1d     Det. By:  LMP  (08/06/20)          EDD:   05/13/21
Anatomy (Fetus B)

 Cranium:               Appears normal         LVOT:                   Previously seen
 Cavum:                 Previously seen        Aortic Arch:            Previously seen
 Ventricles:            Appears normal         Ductal Arch:            Previously seen
 Choroid Plexus:        Previously seen        Diaphragm:              Previously seen
 Cerebellum:            Previously seen        Stomach:                Appears normal, left
                                                                       sided
 Posterior Fossa:       Previously seen        Abdomen:                Appears normal
 Nuchal Fold:           Previously seen        Abdominal Wall:         Previously seen
 Face:                  Appears normal         Cord Vessels:           Previously seen
                        (orbits and profile)
 Lips:                  Previously seen        Kidneys:                Appear normal
 Palate:                Previously seen        Bladder:                Appears normal
 Thoracic:              Previously seen        Spine:                  Previously seen
 Heart:                 Appears normal         Upper Extremities:      Previously seen
                        (4CH, axis, and
                        situs)
 RVOT:                  Previously seen        Lower Extremities:      Previously seen

 Other:  Male gender previously seen. Heels/feet, open hands/5th digits, nasal
         bone, lenses, VC, 3VV, 3VT previously visualized.
Cervix Uterus Adnexa

 Cervix
 Length:           4.27  cm.
 Normal appearance by transabdominal scan.

 Right Ovary
 Not visualized.

 Left Ovary
 Not visualized.
Impression

 Follow up growth for monochorionic diamnoitic twin
 pregnancy.
 Twin A normal stomach, amniotic fluid, bladder and EFW-
 Maternal Right, Cephalic
 Twin B normal stomach, amniotic fluid, bladder and EFW-
 Maternal Left, Breech
 Twin discordance is 1 %

 I discussed today's findings. In addition I confirmed that she
 had a fetal echo scheduled at [HOSPITAL] on [DATE].

 All questions answered.
Recommendations

 Continue twin TTTS surviellance q2 weeks
 Repeat growth in 4 weeks.

## 2022-12-29 DIAGNOSIS — F4381 Prolonged grief disorder: Secondary | ICD-10-CM | POA: Diagnosis not present

## 2022-12-29 DIAGNOSIS — F4310 Post-traumatic stress disorder, unspecified: Secondary | ICD-10-CM | POA: Diagnosis not present

## 2023-01-09 IMAGING — US US MFM OB LIMITED
1 series · 14 of 28 positions shown · non-contrast
Comparison: none

[Series 1: us mfm ob limited · 14 of 29 slices shown]
[im 2/29]
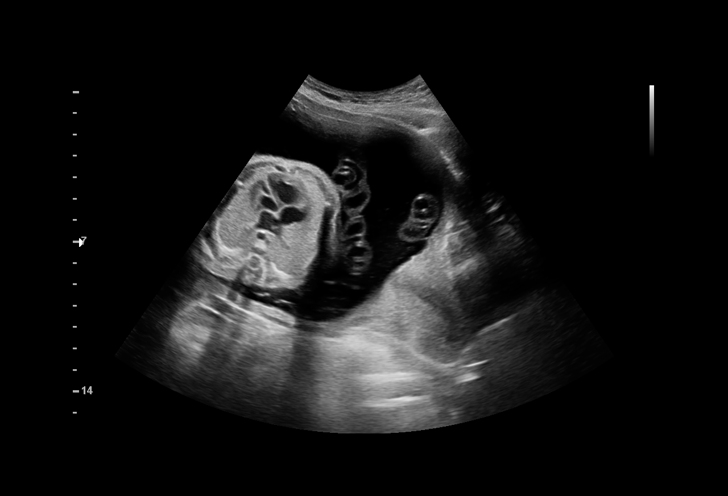
[im 4/29]
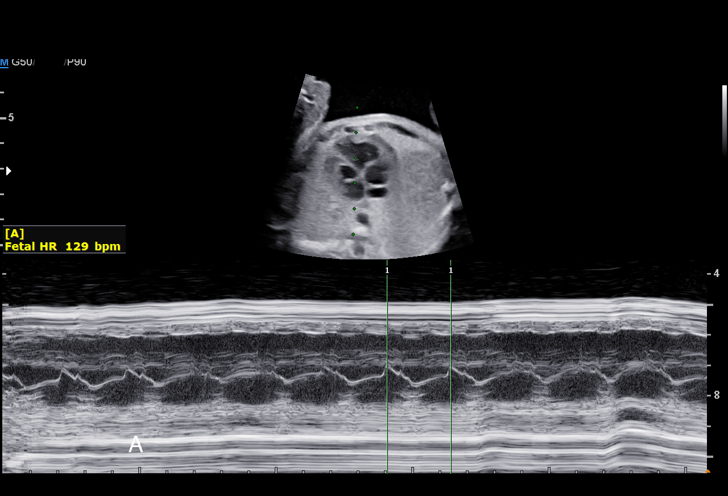
[im 6/29]
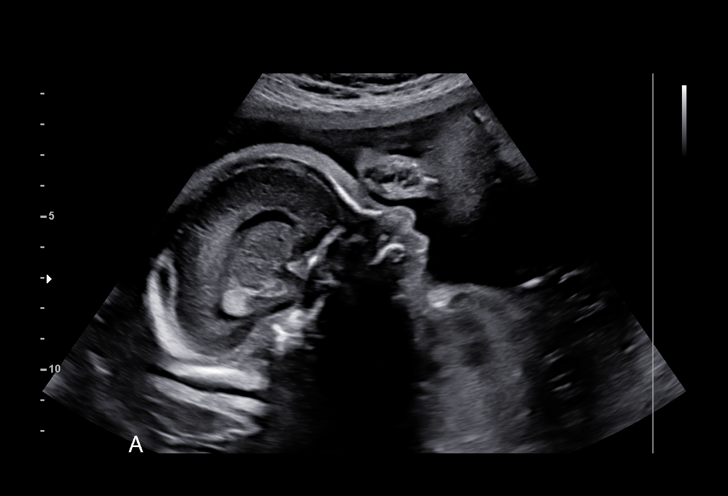
[im 8/29]
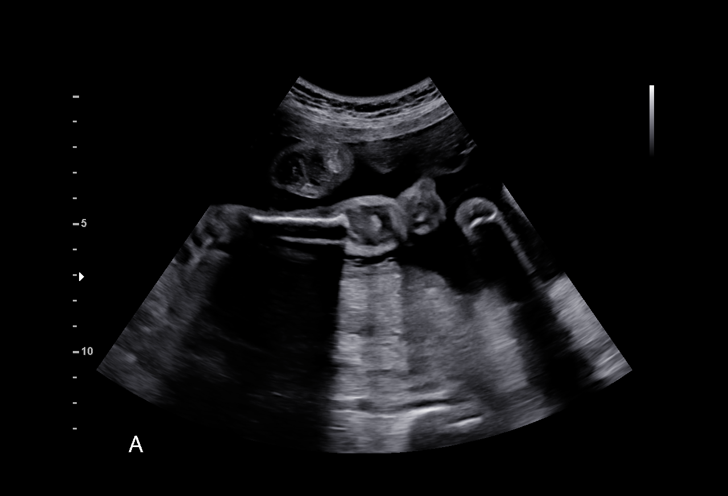
[im 10/29]
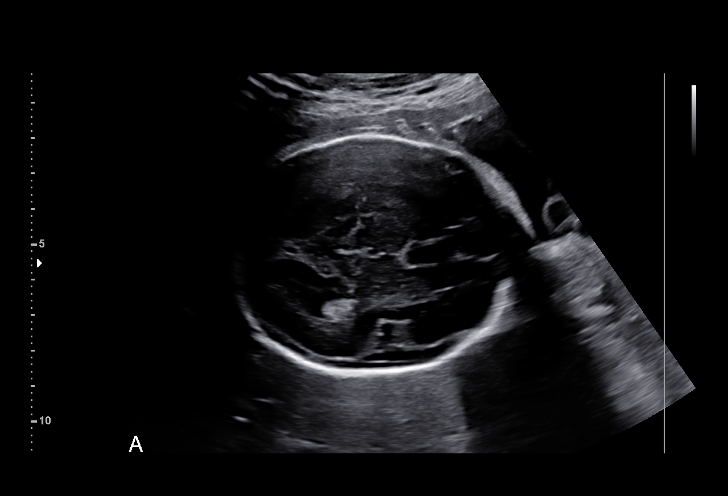
[im 12/29]
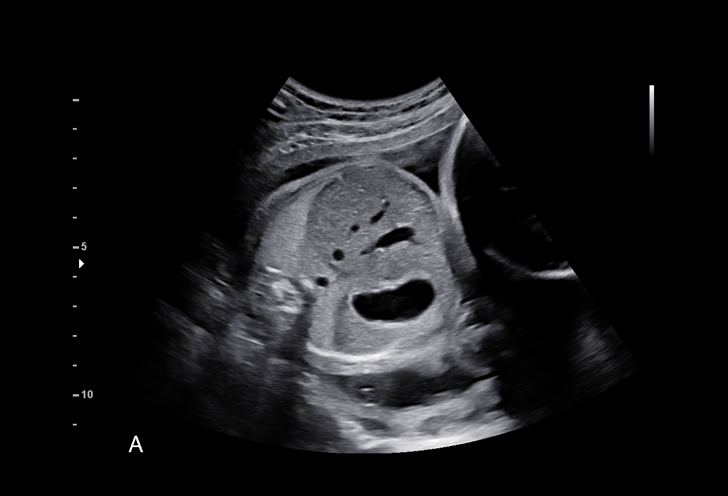
[im 14/29]
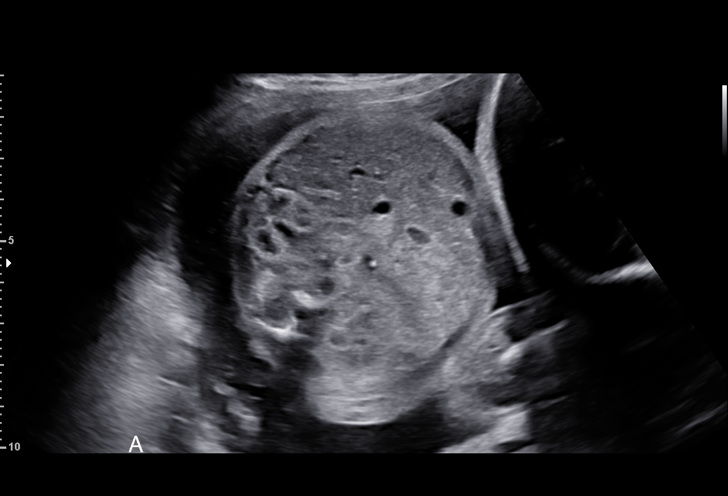
[im 16/29]
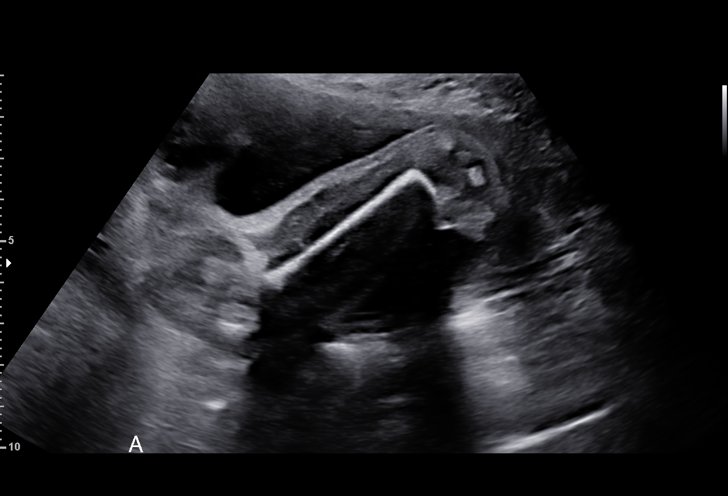
[im 18/29]
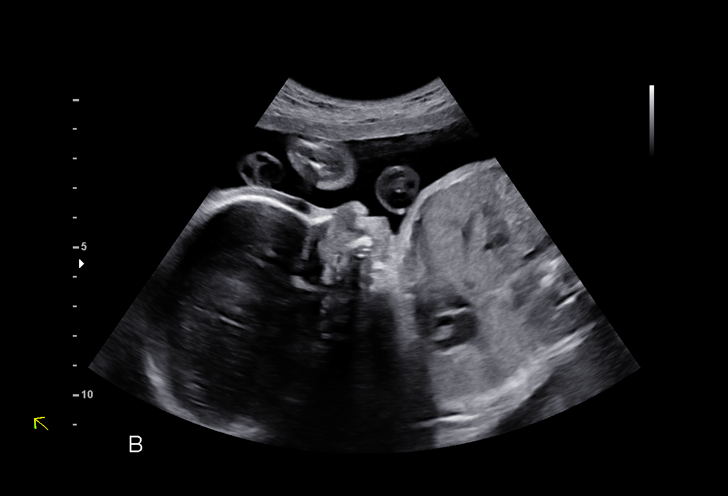
[im 20/29]
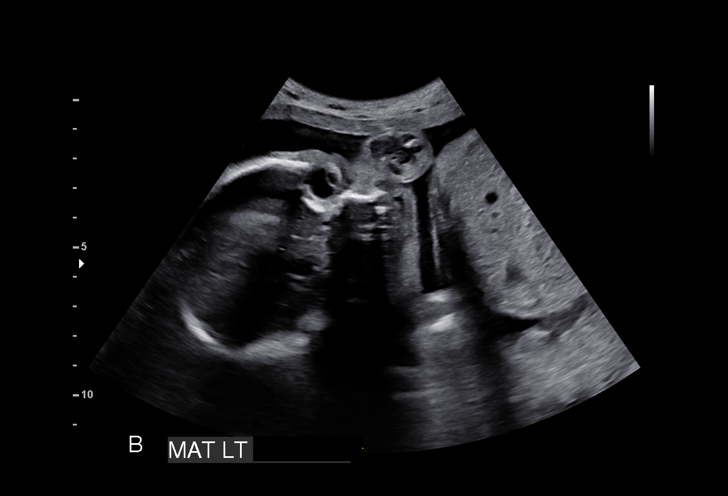
[im 22/29]
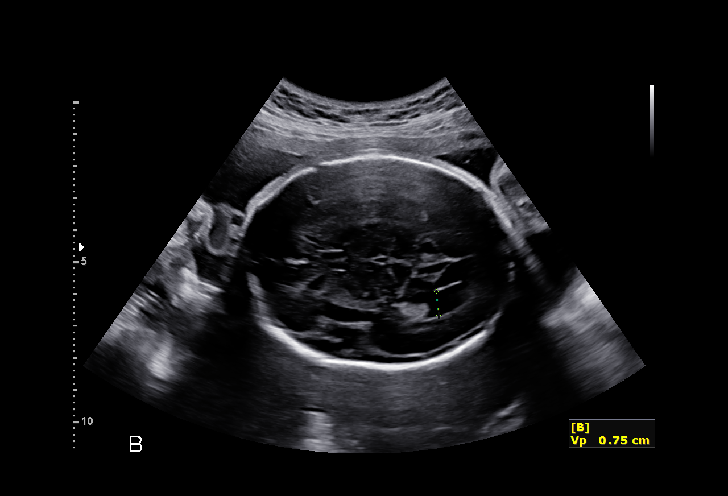
[im 24/29]
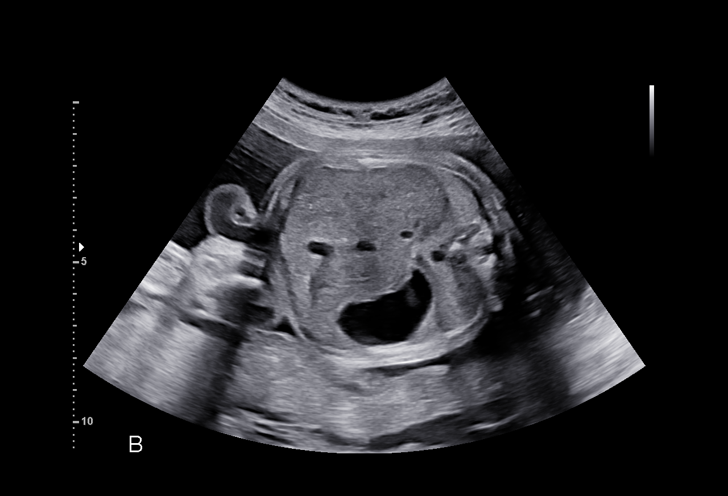
[im 26/29]
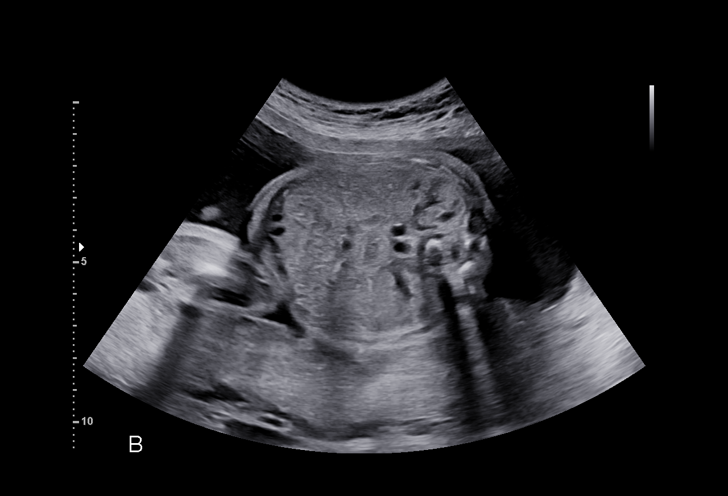
[im 29/29]
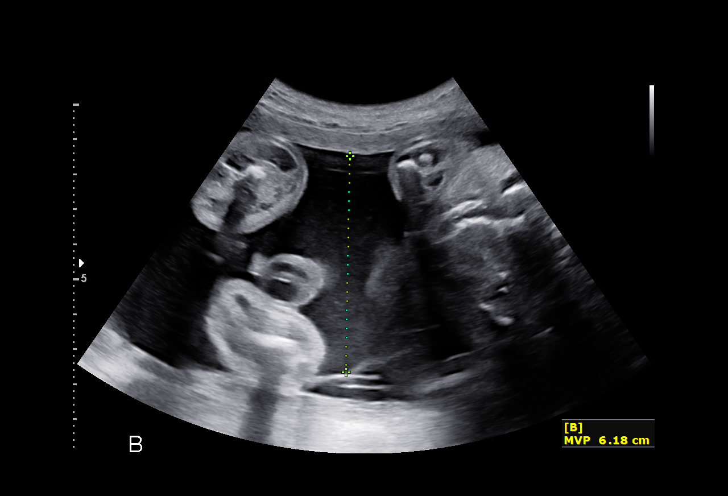

[14 of 28 positions shown; findings below may reference images not displayed]

Indications

 Twin pregnancy, [place]y/Corson, second trimester
 Marginal insertion of umbilical cord affecting
 management of mother in second trimester
 (Twin B)
 Low Risk NIPS(Negative AFP)
 Rh negative state in antepartum (O negative)
 26 weeks gestation of pregnancy
Fetal Evaluation (Fetus A)

 Num Of Fetuses:         2
 Fetal Heart Rate(bpm):  129
 Cardiac Activity:       Observed
 Fetal Lie:              Maternal right side
 Presentation:           Breech
 Placenta:               Posterior
 P. Cord Insertion:      Previously Visualized
 Membrane Desc:      Dividing Membrane seen - Monochorionic

 Amniotic Fluid
 AFI FV:      Within normal limits

                             Largest Pocket(cm)

Biometry (Fetus A)
 LV:        7.5  mm
OB History

 Gravidity:    3         Term:   2
Gestational Age (Fetus A)

 LMP:           26w 1d        Date:  08/06/20                 EDD:   05/13/21
 Best:          26w 1d     Det. By:  LMP  (08/06/20)          EDD:   05/13/21
Anatomy (Fetus A)

 Ventricles:            Appears normal         Kidneys:                Appear normal
 Heart:                 Appears normal         Bladder:                Appears normal
                        (4CH, axis, and
                        situs)
 Stomach:               Appears normal, left
                        sided

Fetal Evaluation (Fetus B)

 Num Of Fetuses:         2
 Fetal Heart Rate(bpm):  145
 Cardiac Activity:       Observed
 Fetal Lie:              Maternal left side
 Presentation:           Breech
 Placenta:               Posterior
 P. Cord Insertion:      Previously Visualized
 Membrane Desc:      Dividing Membrane seen - Monochorionic

 Amniotic Fluid
 AFI FV:      Within normal limits

                             Largest Pocket(cm)

Biometry (Fetus B)

 LV:        7.5  mm
Gestational Age (Fetus B)

 LMP:           26w 1d        Date:  08/06/20                 EDD:   05/13/21
 Best:          26w 1d     Det. By:  LMP  (08/06/20)          EDD:   05/13/21
Anatomy (Fetus B)

 Ventricles:            Appears normal         Kidneys:                Appear normal
 Heart:                 Appears normal         Bladder:                Appears normal
                        (4CH, axis, and
                        situs)
 Stomach:               Appears normal, left
                        sided
Cervix Uterus Adnexa
 Cervix
 Length:           4.43  cm.
 Not visualized (advanced GA >21wks)

 Right Ovary
 Not visualized.

 Left Ovary
 Not visualized.
Impression

 Antenatal testing for monochorionic diamnoitic twin
 pregnancy with known MCI in twin B.
 Twin A normal stomach, amniotic fluid, and bladder- Maternal
 right, Breech
 Twin B normal stomach, amniotic fluid, and bladder Maternal
 left, Breech

 Prior growth exam demonstrated normal fetal growth in both
 twins with 1% discordance.
Recommendations

 Continue q 2 week TTTS screening
 Growth in 2

## 2023-01-20 DIAGNOSIS — F4381 Prolonged grief disorder: Secondary | ICD-10-CM | POA: Diagnosis not present

## 2023-01-20 DIAGNOSIS — F4310 Post-traumatic stress disorder, unspecified: Secondary | ICD-10-CM | POA: Diagnosis not present

## 2023-01-23 IMAGING — US US MFM OB FOLLOW-UP
1 series · 16 of 28 positions shown · non-contrast
Comparison: none

[Series 1: us mfm ob follow-up · 85 acquisitions, 16 frames shown]
[im 1/85]
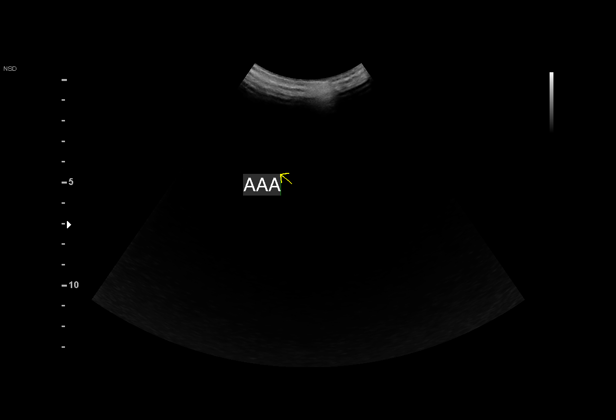
[im 7/85]
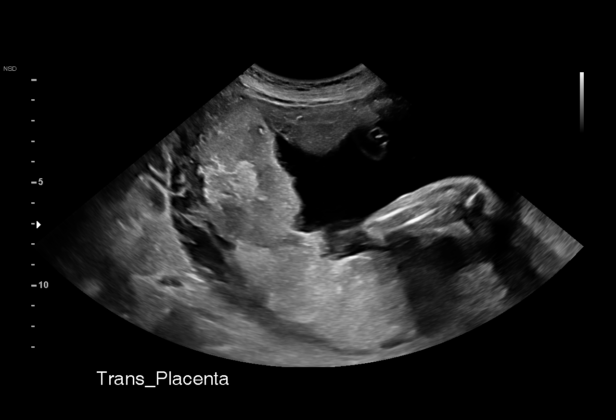
[im 13/85]
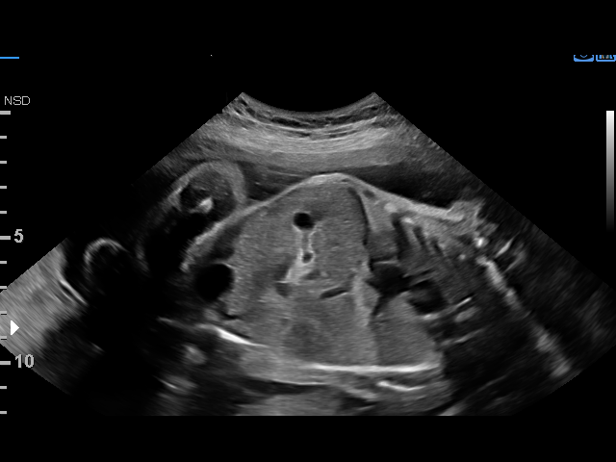
[im 19/85]
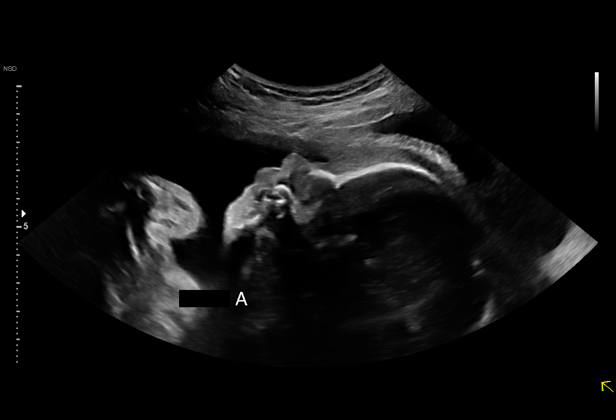
[im 22/85]
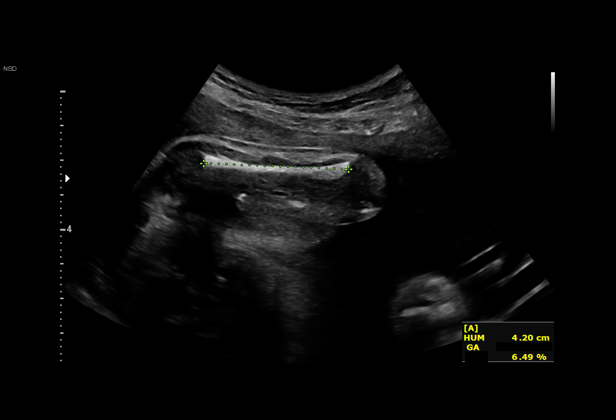
[im 29/85]
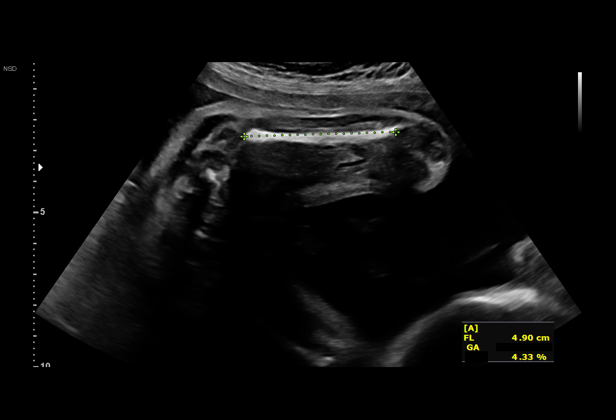
[im 35/85]
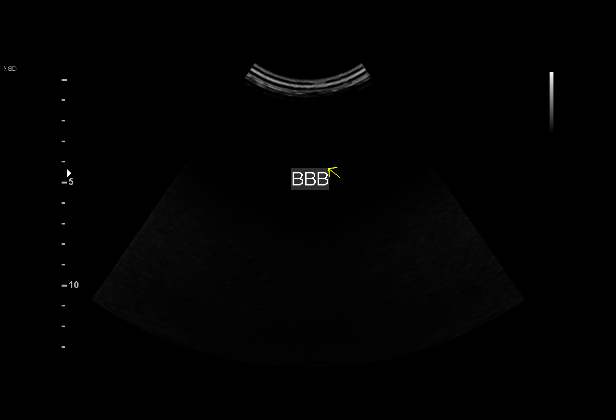
[im 41/85]
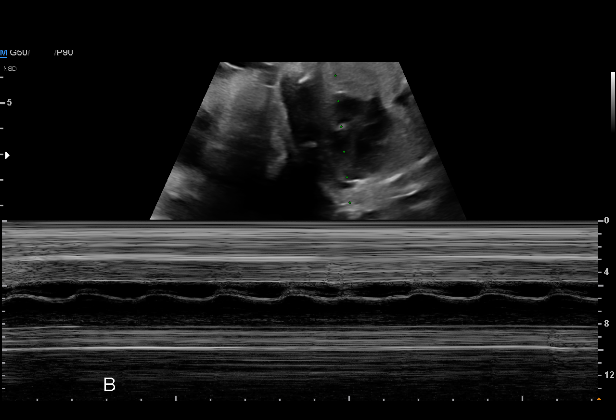
[im 44/85]
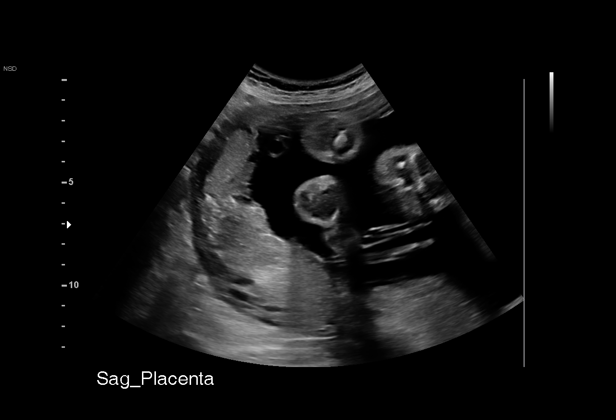
[im 50/85]
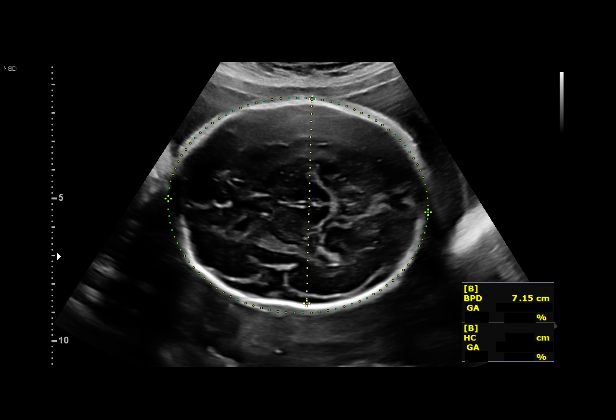
[im 57/85]
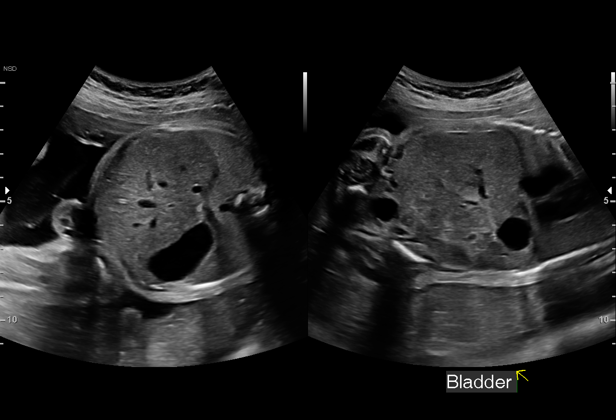
[im 63/85]
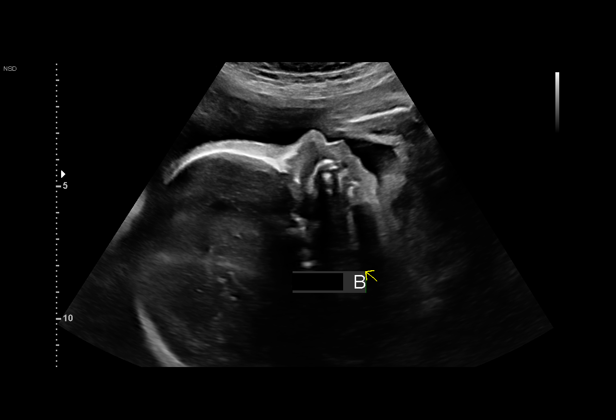
[im 66/85]
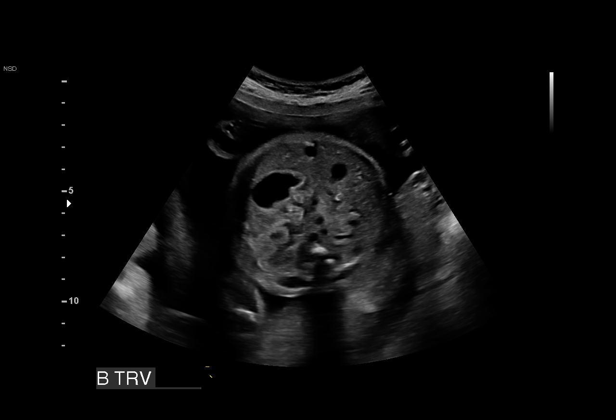
[im 72/85]
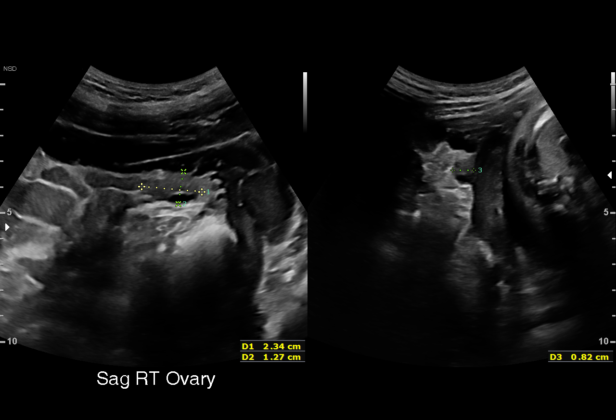
[im 78/85]
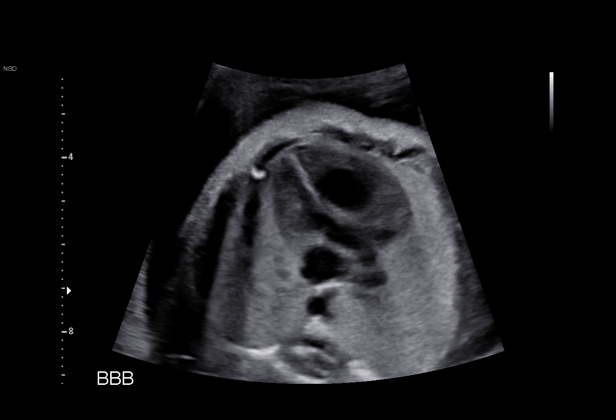
[im 85/85]
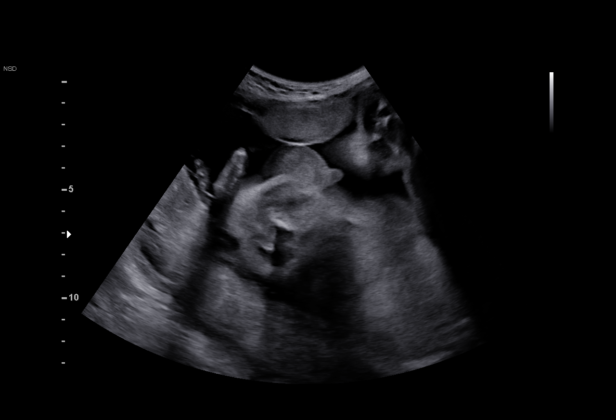

[16 of 28 positions shown; findings below may reference images not displayed]

GEST

Indications

 Twin pregnancy, Melynda/Eyor, third trimester
 28 weeks gestation of pregnancy
 Low Risk NIPS(Negative AFP)
 Rh negative state in antepartum (O negative)
 Fetal echo (see report)
Fetal Evaluation (Fetus A)

 Num Of Fetuses:         2
 Fetal Heart Rate(bpm):  136
 Cardiac Activity:       Observed
 Fetal Lie:              Maternal right side
 Presentation:           Cephalic
 Placenta:               Posterior Right lateral
 P. Cord Insertion:      Previously Visualized
 Membrane Desc:      Dividing Membrane seen - Monochorionic

 Amniotic Fluid
 AFI FV:      Within normal limits

                             Largest Pocket(cm)

Biometry (Fetus A)
 BPD:        74  mm     G. Age:  29w 5d         84  %    CI:        75.38   %    70 - 86
                                                         FL/HC:      18.1   %    18.8 -
 HC:      270.3  mm     G. Age:  29w 3d         61  %    HC/AC:      1.08        1.05 -
 AC:       250   mm     G. Age:  29w 2d         75  %    FL/BPD:     65.9   %    71 - 87
 FL:       48.8  mm     G. Age:  26w 3d        3.7  %    FL/AC:      19.5   %    20 - 24
 HUM:        43  mm     G. Age:  25w 5d        < 5  %
 LV:        6.5  mm

 Est. FW:    9090  gm    2 lb 11 oz      45  %     FW Discordancy      0 \ 4 %
OB History

 Gravidity:    3         Term:   2
Gestational Age (Fetus A)

 LMP:           28w 1d        Date:  08/06/20                 EDD:   05/13/21
 U/S Today:     28w 5d                                        EDD:   05/09/21
 Best:          28w 1d     Det. By:  LMP  (08/06/20)          EDD:   05/13/21
Anatomy (Fetus A)

 Cranium:               Appears normal         LVOT:                   Previously seen
 Cavum:                 Previously seen        Aortic Arch:            Previously seen
 Ventricles:            Appears normal         Ductal Arch:            Previously seen
 Choroid Plexus:        Previously seen        Diaphragm:              Previously seen
 Cerebellum:            Previously seen        Stomach:                Appears normal, left
                                                                       sided
 Posterior Fossa:       Previously seen        Abdomen:                Appears normal
 Nuchal Fold:           Previously seen        Abdominal Wall:         Previously seen
 Face:                  Orbits and profile     Cord Vessels:           Previously seen
                        previously seen
 Lips:                  Previously seen        Kidneys:                Appear normal
 Palate:                Previously seen        Bladder:                Appears normal
 Thoracic:              Previously seen        Spine:                  Previously seen
 Heart:                 See echo report        Upper Extremities:      Previously seen
 RVOT:                  Previously seen        Lower Extremities:      Previously seen

 Other:  Male gender previously seen. Heels/feet, open hands/5th digits, nasal
         bone, lenses, VC, 3VV, 3VT previously visualized.

Fetal Evaluation (Fetus B)

 Num Of Fetuses:         2
 Fetal Heart Rate(bpm):  146
 Cardiac Activity:       Observed
 Fetal Lie:              Maternal left side
 Presentation:           Cephalic
 Placenta:               Posterior
 P. Cord Insertion:      Previously seen as Marginal
 Membrane Desc:      Dividing Membrane seen - Monochorionic

 Amniotic Fluid
 AFI FV:      Within normal limits

                             Largest Pocket(cm)

Biometry (Fetus B)

 BPD:      71.7  mm     G. Age:  28w 5d         59  %    CI:        75.84   %    70 - 86
                                                         FL/HC:      18.9   %    18.8 -
 HC:       261   mm     G. Age:  28w 3d         26  %    HC/AC:      1.06        1.05 -
 AC:      245.6  mm     G. Age:  28w 6d         63  %    FL/BPD:     68.9   %    71 - 87
 FL:       49.4  mm     G. Age:  26w 5d          6  %    FL/AC:      20.1   %    20 - 24
 HUM:        43  mm     G. Age:  25w 5d        < 5  %
 LV:        6.1  mm

 Est. FW:    8811  gm      2 lb 9 oz     33  %     FW Discordancy         4  %
Gestational Age (Fetus B)

 LMP:           28w 1d        Date:  08/06/20                 EDD:   05/13/21
 U/S Today:     28w 1d                                        EDD:   05/13/21
 Best:          28w 1d     Det. By:  LMP  (08/06/20)          EDD:   05/13/21
Anatomy (Fetus B)

 Cranium:               Appears normal         LVOT:                   Previously seen
 Cavum:                 Previously seen        Aortic Arch:            Previously seen
 Ventricles:            Appears normal         Ductal Arch:            Previously seen
 Choroid Plexus:        Previously seen        Diaphragm:              Previously seen
 Cerebellum:            Previously seen        Stomach:                Appears normal, left
                                                                       sided
 Posterior Fossa:       Previously seen        Abdomen:                Appears normal
 Nuchal Fold:           Previously seen        Abdominal Wall:         Previously seen
 Face:                  Appears normal         Cord Vessels:           Previously seen
                        (orbits and profile)
 Lips:                  Previously seen        Kidneys:                Appear normal
 Palate:                Previously seen        Bladder:                Appears normal
 Thoracic:              Previously seen        Spine:                  Previously seen
 Heart:                 Appears normal         Upper Extremities:      Previously seen
                        (4CH, axis, and
                        situs)
 RVOT:                  Previously seen        Lower Extremities:      Previously seen

 Other:  Male gender previously seen. Heels/feet, open hands/5th digits, nasal
         bone, lenses, VC, 3VV, 3VT previously visualized.
Cervix Uterus Adnexa

 Cervix
 Not visualized (advanced GA >76wks)

 Right Ovary
 Within normal limits.

 Left Ovary
 Not visualized.
Comments

 This patient was seen for a follow up growth scan due to a
 monochorionic, diamniotic twin gestation.  The patient had a
 fetal echocardiogram performed with [HOSPITAL] pediatric
 cardiology that showed a tiny mid muscular VSD in twin A.
 The echocardiogram for twin B was within normal limits.  She
 reports that the cardiologist advised her that there is a
 possibility that the VSD may close prior to or immediately
 after delivery.  An echocardiogram for twin A should be
 performed after delivery.  She denies any problems since her
 last exam.
 She was informed that the fetal growth and amniotic fluid
 level appears appropriate for her gestational age for both twin
 A and twin B.
 There were no signs of the twin to twin transfusion syndrome
 noted today.
 A follow-up exam was scheduled in 2 weeks.
 Due to the monochorionic twin gestation, we will start weekly
 fetal testing at 32 weeks.

## 2023-02-05 DIAGNOSIS — F4381 Prolonged grief disorder: Secondary | ICD-10-CM | POA: Diagnosis not present

## 2023-02-05 DIAGNOSIS — F4310 Post-traumatic stress disorder, unspecified: Secondary | ICD-10-CM | POA: Diagnosis not present

## 2023-02-19 DIAGNOSIS — F4310 Post-traumatic stress disorder, unspecified: Secondary | ICD-10-CM | POA: Diagnosis not present

## 2023-02-19 DIAGNOSIS — F4381 Prolonged grief disorder: Secondary | ICD-10-CM | POA: Diagnosis not present

## 2023-03-05 DIAGNOSIS — D496 Neoplasm of unspecified behavior of brain: Secondary | ICD-10-CM | POA: Diagnosis not present

## 2023-03-31 DIAGNOSIS — F4381 Prolonged grief disorder: Secondary | ICD-10-CM | POA: Diagnosis not present

## 2023-03-31 DIAGNOSIS — F4310 Post-traumatic stress disorder, unspecified: Secondary | ICD-10-CM | POA: Diagnosis not present

## 2023-04-21 DIAGNOSIS — F4310 Post-traumatic stress disorder, unspecified: Secondary | ICD-10-CM | POA: Diagnosis not present

## 2023-04-21 DIAGNOSIS — F4381 Prolonged grief disorder: Secondary | ICD-10-CM | POA: Diagnosis not present

## 2023-05-27 ENCOUNTER — Ambulatory Visit: Admitting: Family Medicine

## 2023-05-28 DIAGNOSIS — F4310 Post-traumatic stress disorder, unspecified: Secondary | ICD-10-CM | POA: Diagnosis not present

## 2023-05-28 DIAGNOSIS — F4381 Prolonged grief disorder: Secondary | ICD-10-CM | POA: Diagnosis not present

## 2023-06-16 DIAGNOSIS — F4310 Post-traumatic stress disorder, unspecified: Secondary | ICD-10-CM | POA: Diagnosis not present

## 2023-06-16 DIAGNOSIS — F4381 Prolonged grief disorder: Secondary | ICD-10-CM | POA: Diagnosis not present

## 2023-06-17 ENCOUNTER — Ambulatory Visit: Admitting: Obstetrics & Gynecology

## 2023-06-17 VITALS — BP 119/71 | HR 79 | Ht <= 58 in | Wt 127.0 lb

## 2023-06-17 DIAGNOSIS — N898 Other specified noninflammatory disorders of vagina: Secondary | ICD-10-CM

## 2023-06-17 NOTE — Progress Notes (Signed)
 Patient ID: Bonnie Bell, female   DOB: 1985/07/29, 38 y.o.   MRN: [id number]  Chief Complaint  Patient presents with   Gynecologic Exam    ? Cyst on vaginal wall.  Feels pressure    HPI Bonnie Bell is a 38 y.o. female.  [id number] Patient's last menstrual period was 05/26/2023 (approximate). She has Mirena in place. Vaginal wall cyst has been drained twice and she has some pressure in the vagina and requests exam. 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   Headache     Past Surgical History:  Procedure Laterality Date   surgery on right foot     Cancer    Family History  Problem Relation Age of Onset   Lung cancer Mother    Kidney disease Mother     Social History Social History   Tobacco Use   Smoking status: Never   Smokeless tobacco: Never  Vaping Use   Vaping status: Never Used  Substance Use Topics   Alcohol use: Never   Drug use: Never    Allergies  Allergen Reactions   Oxycodone Rash   Topiramate Other (See Comments)    Hands/feet tingling Hands/feet tingling     Current Outpatient Medications  Medication Sig Dispense Refill   escitalopram (LEXAPRO) 20 MG tablet Take 1 tablet (20 mg total) by mouth daily. 90 tablet 1   fluticasone (FLONASE) 50 MCG/ACT nasal spray Place 2 sprays into both nostrils daily. 16 g 6   ondansetron (ZOFRAN-ODT) 4 MG disintegrating tablet Take 1 tablet (4 mg total) by mouth every 8 (eight) hours as needed for nausea or vomiting. 30 tablet 1   No current facility-administered medications for this visit.    Review of Systems Review of Systems  Genitourinary:  Positive for dyspareunia (occasional) and menstrual problem (irregular but not heavy). Negative for pelvic pain (some pressure), vaginal bleeding and vaginal discharge.    Blood pressure 119/71, pulse 79, height 4\' 9"  (1.448 m), weight 127 lb (57.6 kg), last menstrual period 05/26/2023.  Physical Exam Physical  Exam Vitals and nursing note reviewed. Exam conducted with a chaperone present.  Constitutional:      Appearance: Normal appearance.  HENT:     Head: Normocephalic and atraumatic.  Cardiovascular:     Rate and Rhythm: Normal rate.  Genitourinary:    General: Normal vulva.     Exam position: Lithotomy position.     Vagina: Normal.     Cervix: Normal.     Uterus: Normal.      Adnexa: Right adnexa normal and left adnexa normal.     Comments: IUd string 2 cm Musculoskeletal:     Cervical back: Normal range of motion.  Neurological:     Mental Status: She is alert.  Psychiatric:        Mood and Affect: Mood normal.        Behavior: Behavior normal.     Data Reviewed   Assessment No recurrence of vaginal cyst  Mirena in place  Plan RTC for routine annual exam    Scheryl Darter 06/17/2023, 3:54 PM

## 2023-06-30 ENCOUNTER — Ambulatory Visit (INDEPENDENT_AMBULATORY_CARE_PROVIDER_SITE_OTHER): Payer: BC Managed Care – PPO | Admitting: Physician Assistant

## 2023-06-30 ENCOUNTER — Encounter: Payer: Self-pay | Admitting: Physician Assistant

## 2023-06-30 VITALS — BP 102/64 | HR 84 | Temp 97.6°F | Ht <= 58 in | Wt 124.4 lb

## 2023-06-30 DIAGNOSIS — Z0000 Encounter for general adult medical examination without abnormal findings: Secondary | ICD-10-CM | POA: Diagnosis not present

## 2023-06-30 DIAGNOSIS — N3001 Acute cystitis with hematuria: Secondary | ICD-10-CM | POA: Diagnosis not present

## 2023-06-30 DIAGNOSIS — N926 Irregular menstruation, unspecified: Secondary | ICD-10-CM | POA: Diagnosis not present

## 2023-06-30 DIAGNOSIS — F418 Other specified anxiety disorders: Secondary | ICD-10-CM | POA: Diagnosis not present

## 2023-06-30 LAB — POCT URINALYSIS DIP (CLINITEK)
Bilirubin, UA: NEGATIVE
Glucose, UA: NEGATIVE mg/dL
Ketones, POC UA: NEGATIVE mg/dL
Nitrite, UA: NEGATIVE
POC PROTEIN,UA: NEGATIVE
Spec Grav, UA: <=1.005 — AB (ref 1.010–1.025)
Urobilinogen, UA: 0.2 U/dL
pH, UA: 6.5 (ref 5.0–8.0)

## 2023-06-30 LAB — POCT URINE PREGNANCY: Preg Test, Ur: NEGATIVE

## 2023-06-30 MED ORDER — LORAZEPAM 0.5 MG PO TABS: 0.5000 mg | ORAL_TABLET | Freq: Two times a day (BID) | ORAL | 1 refills | Status: DC | PRN

## 2023-06-30 MED ORDER — NITROFURANTOIN MONOHYD MACRO 100 MG PO CAPS: 100.0000 mg | ORAL_CAPSULE | Freq: Two times a day (BID) | ORAL | 0 refills | Status: DC

## 2023-06-30 NOTE — Progress Notes (Signed)
 Subjective:  Patient ID: Bonnie Bell, female    DOB: 04-02-1985  Age: 38 y.o. MRN: [id number]  Chief Complaint  Patient presents with   Annual Exam    HPI Well Adult Physical: Patient here for a comprehensive physical exam.The patient reports  she has been having abnormal menstrual cycles - did see GYN but they think perhaps her prior brain tumor treatment  and of note she does have IUD -- also would like to try ativan again as needed for anxiety Do you take any herbs or supplements that were not prescribed by a doctor? no Are you taking calcium supplements? no Are you taking aspirin daily? no  Encounter for general adult medical examination without abnormal findings  Physical ("At Risk" items are starred): Patient's last physical exam was 1 year ago .  Patient is not afflicted from Stress Incontinence and Urge Incontinence  Patient wears a seat belts Patient has smoke detectors and has carbon monoxide detectors. Patient practices appropriate gun safety. Patient wears sunscreen with extended sun exposure. Dental Care: brushes and flosses daily. Last dental visit: is due Vision impairments:wears glasses Ophthalmology/Optometry: Annual visit.  Hearing loss: none  Patient's last menstrual period was 05/26/2023 (approximate).  Safe at home: Yes Self breast exams: Yes Last pap: pt is up to date - sees GYN      06/30/2023    9:12 AM 12/22/2022    8:27 AM 10/20/2022    9:56 AM 08/05/2022    2:29 PM 03/02/2022    8:13 AM  Depression screen PHQ 2/9  Decreased Interest 2 0 1 1 1   Down, Depressed, Hopeless 1 0 3 0 0  PHQ - 2 Score 3 0 4 1 1   Altered sleeping 2 0 3 0 2  Tired, decreased energy 2 0 2 0 2  Change in appetite 0  3 0 0  Feeling bad or failure about yourself  0 0 3 0 0  Trouble concentrating 1 0 3 1 1   Moving slowly or fidgety/restless 0 0 3 0 1  Suicidal thoughts 0 0 3 0 0  PHQ-9 Score 8 0 24 2 7   Difficult doing work/chores  Not difficult at all  Not difficult at all  Not difficult at all         04/29/2022    1:25 PM 08/05/2022    2:29 PM 10/20/2022    9:56 AM 12/22/2022    8:26 AM 06/30/2023    9:12 AM  Fall Risk  Falls in the past year? 0 0 0 0 0  Was there an injury with Fall? 0 0 0 0 0  Fall Risk Category Calculator 0 0 0 0 0  Patient at Risk for Falls Due to No Fall Risks No Fall Risks No Fall Risks No Fall Risks No Fall Risks  Fall risk Follow up Falls evaluation completed Falls evaluation completed Falls evaluation completed Falls evaluation completed              Social Hx   Social History   Socioeconomic History   Marital status: Married    Spouse name: Cagney Steenson   Number of children: 4   Years of education: Not on file   Highest education level: Not on file  Occupational History   Not on file  Tobacco Use   Smoking status: Never   Smokeless tobacco: Never  Vaping Use   Vaping status: Never Used  Substance and Sexual Activity   Alcohol use: Never   Drug use:  Never   Sexual activity: Yes    Partners: Male  Other Topics Concern   Not on file  Social History Narrative   Not on file   Social Drivers of Health   Financial Resource Strain: Low Risk  (06/12/2022)   Received [facility], [facility]   Overall Financial Resource Strain (CARDIA)    Difficulty of Paying Living Expenses: Not hard at all  Food Insecurity: No Food Insecurity (06/26/2022)   Hunger Vital Sign    Worried About Running Out of Food in the Last Year: Never true    Ran Out of Food in the Last Year: Never true  Transportation Needs: No Transportation Needs (06/26/2022)   PRAPARE - Administrator, Civil Service (Medical): No    Lack of Transportation (Non-Medical): No  Physical Activity: Insufficiently Active (08/26/2021)   Exercise Vital Sign    Days of Exercise per Week: 5 days    Minutes of Exercise per Session: 20 min  Stress: No Stress Concern Present (08/26/2021)   [employer] of Occupational Health - Occupational  Stress Questionnaire    Feeling of Stress : Not at all  Social Connections: Moderately Integrated (08/26/2021)   Social Connection and Isolation Panel [NHANES]    Frequency of Communication with Friends and Family: More than three times a week    Frequency of Social Gatherings with Friends and Family: More than three times a week    Attends Religious Services: More than 4 times per year    Active Member of [employer] or Organizations: No    Attends Engineer, structural: Never    Marital Status: Married   Past Medical History:  Diagnosis Date   Anxiety    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  E/N/T: Negative for ear pain, nasal congestion and sore throat.  CARDIOVASCULAR: Negative for chest pain, dizziness, palpitations and pedal edema.  RESPIRATORY: Negative for recent cough and dyspnea.  GASTROINTESTINAL: Negative for abdominal pain, acid reflux symptoms, constipation, diarrhea, nausea and vomiting.  MSK: Negative for arthralgias and myalgias.  INTEGUMENTARY: Negative for rash.  NEUROLOGICAL: Negative for dizziness and headaches.  PSYCHIATRIC: has had some mild breakthrough anxiety  Objective:  PHYSICAL EXAM:   BP 102/64   Pulse 84   Temp 97.6 F (36.4 C)   Ht 4\' 9"  (1.448 m)   Wt 124 lb 6.4 oz (56.4 kg)   LMP 05/26/2023 (Approximate) Comment: Menses very different since brain tumor removed  SpO2 98%   BMI 26.92 kg/m   Vision Screening   Right eye Left eye Both eyes  Without correction     With correction 20/20 20/20 20/20     GEN: Well nourished, well developed, in no acute distress  HEENT: normal external ears and nose - normal external auditory  canals and TMS - hearing grossly normal - - Lips, Teeth and Gums - normal  Oropharynx - normal mucosa, palate, and posterior pharynx Neck: no JVD or masses - no thyromegaly Cardiac: RRR; no murmurs, rubs, or gallops,no edema - no significant varicosities Respiratory:  normal respiratory rate and pattern with no  distress - normal breath sounds with no rales, rhonchi, wheezes or rubs GI: normal bowel sounds, no masses or tenderness MS: no deformity or atrophy  Skin: warm and dry, no rash  Neuro:  Alert and Oriented x 3, - CN II-Xii grossly intact Psych: euthymic mood, appropriate affect and demeanor  Office Visit on 06/30/2023  Component Date Value Ref Range Status   Color, UA 06/30/2023 colorless (A)  yellow Final   Clarity, UA 06/30/2023 clear  clear Final   Glucose, UA 06/30/2023 negative  negative mg/dL Final   Bilirubin, UA 52/84/1324 negative  negative Final   Ketones, POC UA 06/30/2023 negative  negative mg/dL Final   Spec Grav, UA 40/12/2723 <=1.005 (A)  1.010 - 1.025 Final   Blood, UA 06/30/2023 trace-lysed (A)  negative Final   pH, UA 06/30/2023 6.5  5.0 - 8.0 Final   POC PROTEIN,UA 06/30/2023 negative  negative, trace Final   Urobilinogen, UA 06/30/2023 0.2  0.2 or 1.0 E.U./dL Final   Nitrite, UA 36/64/4034 Negative  Negative Final   Leukocytes, UA 06/30/2023 Large (3+) (A)  Negative Final   Preg Test, Ur 06/30/2023 Negative  Negative Final    Assessment & Plan:  Annual physical exam -     CBC with Differential/Platelet -     Comprehensive metabolic panel with GFR -     TSH -     Lipid panel -     POCT URINALYSIS DIP (CLINITEK)  Abnormal menses -     POCT urine pregnancy Follow up with GYN as directed Anxiety with depression -     LORazepam; Take 1 tablet (0.5 mg total) by mouth 2 (two) times daily as needed for anxiety.  Dispense: 30 tablet; Refill: 1 Cystitis with hematuria Urine culture pending Rx macrobid 100mg  bid   This is a list of the screening  recommended for you and due dates:  Health Maintenance  Topic Date Due   Pap with HPV screening  09/29/2023*   Flu Shot  10/15/2023   DTaP/Tdap/Td vaccine (3 - Td or Tdap) 02/15/2031   HIV Screening  Completed   HPV Vaccine  Aged Out   Meningitis B Vaccine  Aged Out   COVID-19 Vaccine  Discontinued  *Topic was postponed. The date shown is not the original due date.     Follow-up: Return in about 6 months (around 12/30/2023) for follow-up.  An After Visit Summary was printed and given to the patient.  Anthonette Bastos [facility] [phone]

## 2023-07-01 ENCOUNTER — Encounter: Payer: Self-pay | Admitting: Physician Assistant

## 2023-07-01 LAB — COMPREHENSIVE METABOLIC PANEL WITH GFR
ALT: 6 IU/L (ref 0–32)
AST: 17 IU/L (ref 0–40)
Albumin: 4.6 g/dL (ref 3.9–4.9)
Alkaline Phosphatase: 56 IU/L (ref 44–121)
BUN/Creatinine Ratio: 15 (ref 9–23)
BUN: 10 mg/dL (ref 6–20)
Bilirubin Total: 0.7 mg/dL (ref 0.0–1.2)
CO2: 20 mmol/L (ref 20–29)
Calcium: 9.5 mg/dL (ref 8.7–10.2)
Chloride: 104 mmol/L (ref 96–106)
Creatinine, Ser: 0.66 mg/dL (ref 0.57–1.00)
Globulin, Total: 2.3 g/dL (ref 1.5–4.5)
Glucose: 80 mg/dL (ref 70–99)
Potassium: 4.9 mmol/L (ref 3.5–5.2)
Sodium: 140 mmol/L (ref 134–144)
Total Protein: 6.9 g/dL (ref 6.0–8.5)
eGFR: 117 mL/min/{1.73_m2} (ref >59)

## 2023-07-01 LAB — LIPID PANEL
Chol/HDL Ratio: 3 ratio (ref 0.0–4.4)
Cholesterol, Total: 131 mg/dL (ref 100–199)
HDL: 44 mg/dL (ref >39)
LDL Chol Calc (NIH): 73 mg/dL (ref 0–99)
Triglycerides: 69 mg/dL (ref 0–149)
VLDL Cholesterol Cal: 14 mg/dL (ref 5–40)

## 2023-07-01 LAB — CBC WITH DIFFERENTIAL/PLATELET
Basophils Absolute: 0.1 10*3/uL (ref 0.0–0.2)
Basos: 1 %
EOS (ABSOLUTE): 0.1 10*3/uL (ref 0.0–0.4)
Eos: 1 %
Hematocrit: 42 % (ref 34.0–46.6)
Hemoglobin: 13.4 g/dL (ref 11.1–15.9)
Immature Grans (Abs): 0 10*3/uL (ref 0.0–0.1)
Immature Granulocytes: 0 %
Lymphocytes Absolute: 1.6 10*3/uL (ref 0.7–3.1)
Lymphs: 28 %
MCH: 28.7 pg (ref 26.6–33.0)
MCHC: 31.9 g/dL (ref 31.5–35.7)
MCV: 90 fL (ref 79–97)
Monocytes Absolute: 0.4 10*3/uL (ref 0.1–0.9)
Monocytes: 6 %
Neutrophils Absolute: 3.8 10*3/uL (ref 1.4–7.0)
Neutrophils: 64 %
Platelets: 228 10*3/uL (ref 150–450)
RBC: 4.67 x10E6/uL (ref 3.77–5.28)
RDW: 12.7 % (ref 11.7–15.4)
WBC: 5.9 10*3/uL (ref 3.4–10.8)

## 2023-07-01 LAB — TSH: TSH: 0.851 u[IU]/mL (ref 0.450–4.500)

## 2023-07-02 LAB — URINE CULTURE

## 2023-07-07 DIAGNOSIS — F4381 Prolonged grief disorder: Secondary | ICD-10-CM | POA: Diagnosis not present

## 2023-07-07 DIAGNOSIS — F4310 Post-traumatic stress disorder, unspecified: Secondary | ICD-10-CM | POA: Diagnosis not present

## 2023-07-16 DIAGNOSIS — F4381 Prolonged grief disorder: Secondary | ICD-10-CM | POA: Diagnosis not present

## 2023-07-16 DIAGNOSIS — F4310 Post-traumatic stress disorder, unspecified: Secondary | ICD-10-CM | POA: Diagnosis not present

## 2023-08-11 DIAGNOSIS — F4310 Post-traumatic stress disorder, unspecified: Secondary | ICD-10-CM | POA: Diagnosis not present

## 2023-08-11 DIAGNOSIS — F4381 Prolonged grief disorder: Secondary | ICD-10-CM | POA: Diagnosis not present

## 2023-08-18 DIAGNOSIS — F4381 Prolonged grief disorder: Secondary | ICD-10-CM | POA: Diagnosis not present

## 2023-08-18 DIAGNOSIS — F4310 Post-traumatic stress disorder, unspecified: Secondary | ICD-10-CM | POA: Diagnosis not present

## 2023-09-20 DIAGNOSIS — Z885 Allergy status to narcotic agent status: Secondary | ICD-10-CM | POA: Diagnosis not present

## 2023-09-20 DIAGNOSIS — Z8603 Personal history of neoplasm of uncertain behavior: Secondary | ICD-10-CM | POA: Diagnosis not present

## 2023-09-20 DIAGNOSIS — D496 Neoplasm of unspecified behavior of brain: Secondary | ICD-10-CM | POA: Diagnosis not present

## 2023-09-20 DIAGNOSIS — Z09 Encounter for follow-up examination after completed treatment for conditions other than malignant neoplasm: Secondary | ICD-10-CM | POA: Diagnosis not present

## 2023-09-20 DIAGNOSIS — D329 Benign neoplasm of meninges, unspecified: Secondary | ICD-10-CM | POA: Diagnosis not present

## 2023-09-22 DIAGNOSIS — F4381 Prolonged grief disorder: Secondary | ICD-10-CM | POA: Diagnosis not present

## 2023-09-22 DIAGNOSIS — F4310 Post-traumatic stress disorder, unspecified: Secondary | ICD-10-CM | POA: Diagnosis not present

## 2023-09-29 DIAGNOSIS — F4310 Post-traumatic stress disorder, unspecified: Secondary | ICD-10-CM | POA: Diagnosis not present

## 2023-09-29 DIAGNOSIS — F4381 Prolonged grief disorder: Secondary | ICD-10-CM | POA: Diagnosis not present

## 2023-10-07 ENCOUNTER — Encounter: Payer: Self-pay | Admitting: Physician Assistant

## 2023-10-07 ENCOUNTER — Ambulatory Visit (INDEPENDENT_AMBULATORY_CARE_PROVIDER_SITE_OTHER): Admitting: Physician Assistant

## 2023-10-07 VITALS — BP 100/60 | HR 90 | Temp 98.0°F | Resp 16 | Ht <= 58 in | Wt 127.0 lb

## 2023-10-07 DIAGNOSIS — J40 Bronchitis, not specified as acute or chronic: Secondary | ICD-10-CM

## 2023-10-07 LAB — POC COVID19 BINAXNOW: SARS Coronavirus 2 Ag: NEGATIVE

## 2023-10-07 MED ORDER — PREDNISONE 20 MG PO TABS: 20.0000 mg | ORAL_TABLET | Freq: Two times a day (BID) | ORAL | 0 refills | Status: DC

## 2023-10-07 MED ORDER — HYDROCODONE BIT-HOMATROP MBR 5-1.5 MG/5ML PO SOLN: 5.0000 mL | Freq: Four times a day (QID) | ORAL | 0 refills | Status: DC | PRN

## 2023-10-07 MED ORDER — AZITHROMYCIN 250 MG PO TABS: ORAL_TABLET | ORAL | 0 refills | Status: AC

## 2023-10-07 NOTE — Progress Notes (Signed)
   Acute Office Visit  Subjective:    Patient ID: Bonnie Bell, female    DOB: 1985/07/27, 38 y.o.   MRN: [id number]  Chief Complaint  Patient presents with   Cough    HPI: Patient is in today for complaints of malaise and productive cough since Sunday - has had mild sore throat and pnd Denies wheezing - denies fever   Current Outpatient Medications:    azithromycin  (ZITHROMAX ) 250 MG tablet, Take 2 tablets on day 1, then 1 tablet daily on days 2 through 5, Disp: 6 tablet, Rfl: 0   escitalopram  (LEXAPRO ) 20 MG tablet, Take 1 tablet (20 mg total) by mouth daily., Disp: 90 tablet, Rfl: 1   fluticasone  (FLONASE ) 50 MCG/ACT nasal spray, Place 2 sprays into both nostrils daily., Disp: 16 g, Rfl: 6   HYDROcodone  bit-homatropine (HYDROMET) 5-1.5 MG/5ML syrup, Take 5 mLs by mouth every 6 (six) hours as needed., Disp: 120 mL, Rfl: 0   LORazepam  (ATIVAN ) 0.5 MG tablet, Take 1 tablet (0.5 mg total) by mouth 2 (two) times daily as needed for anxiety., Disp: 30 tablet, Rfl: 1   predniSONE  (DELTASONE ) 20 MG tablet, Take 1 tablet (20 mg total) by mouth 2 (two) times daily with a meal., Disp: 10 tablet, Rfl: 0  Allergies  Allergen Reactions   Oxycodone  Rash   Topiramate Other (See Comments)    Hands/feet tingling Hands/feet tingling     ROS CONSTITUTIONAL: see HPI E/N/T: see HPI CARDIOVASCULAR: Negative for chest pain, dizziness, palpitations and pedal edema.  RESPIRATORY: see HPI      Objective:    PHYSICAL EXAM:   BP 100/60   Pulse 90   Temp 98 F (36.7 C)   Resp 16   Ht 4' 9 (1.448 m)   Wt 127 lb (57.6 kg)   LMP  (LMP Unknown)   SpO2 98%   BMI 27.48 kg/m    GEN: Well nourished, well developed, in no acute distress  HEENT: normal external ears and nose - normal external auditory canals and TMS - - Lips, Teeth and Gums - normal  Oropharynx - erythema/pnd Cardiac: RRR; no murmurs,  Respiratory:  faint scattered rhonchi  Office Visit on 10/07/2023  Component Date Value  Ref Range Status   SARS Coronavirus 2 Ag 10/07/2023 Negative  Negative Final       Assessment & Plan:    Bronchitis -     POC COVID-19 BinaxNow -     Azithromycin ; Take 2 tablets on day 1, then 1 tablet daily on days 2 through 5  Dispense: 6 tablet; Refill: 0 -     predniSONE ; Take 1 tablet (20 mg total) by mouth 2 (two) times daily with a meal.  Dispense: 10 tablet; Refill: 0 -     HYDROcodone  Bit-Homatrop MBr; Take 5 mLs by mouth every 6 (six) hours as needed.  Dispense: 120 mL; Refill: 0     Follow-up: Return if symptoms worsen or fail to improve.  An After Visit Summary was printed and given to the patient.  CAMIE JONELLE NICHOLAUS DEVONNA [facility] [phone]

## 2023-11-03 DIAGNOSIS — F4310 Post-traumatic stress disorder, unspecified: Secondary | ICD-10-CM | POA: Diagnosis not present

## 2023-11-03 DIAGNOSIS — F4381 Prolonged grief disorder: Secondary | ICD-10-CM | POA: Diagnosis not present

## 2023-11-17 DIAGNOSIS — F4381 Prolonged grief disorder: Secondary | ICD-10-CM | POA: Diagnosis not present

## 2023-11-17 DIAGNOSIS — F4310 Post-traumatic stress disorder, unspecified: Secondary | ICD-10-CM | POA: Diagnosis not present

## 2023-11-26 DIAGNOSIS — F4310 Post-traumatic stress disorder, unspecified: Secondary | ICD-10-CM | POA: Diagnosis not present

## 2023-11-26 DIAGNOSIS — F4381 Prolonged grief disorder: Secondary | ICD-10-CM | POA: Diagnosis not present

## 2023-12-10 DIAGNOSIS — F4310 Post-traumatic stress disorder, unspecified: Secondary | ICD-10-CM | POA: Diagnosis not present

## 2023-12-10 DIAGNOSIS — F4381 Prolonged grief disorder: Secondary | ICD-10-CM | POA: Diagnosis not present

## 2023-12-24 DIAGNOSIS — F4381 Prolonged grief disorder: Secondary | ICD-10-CM | POA: Diagnosis not present

## 2023-12-24 DIAGNOSIS — F4310 Post-traumatic stress disorder, unspecified: Secondary | ICD-10-CM | POA: Diagnosis not present

## 2023-12-30 ENCOUNTER — Ambulatory Visit (INDEPENDENT_AMBULATORY_CARE_PROVIDER_SITE_OTHER): Admitting: Physician Assistant

## 2023-12-30 ENCOUNTER — Encounter: Payer: Self-pay | Admitting: Physician Assistant

## 2023-12-30 VITALS — BP 102/60 | HR 87 | Temp 97.7°F | Ht <= 58 in | Wt 125.6 lb

## 2023-12-30 DIAGNOSIS — F418 Other specified anxiety disorders: Secondary | ICD-10-CM | POA: Diagnosis not present

## 2023-12-30 DIAGNOSIS — D496 Neoplasm of unspecified behavior of brain: Secondary | ICD-10-CM | POA: Diagnosis not present

## 2023-12-30 MED ORDER — DULOXETINE HCL 30 MG PO CPEP: 30.0000 mg | ORAL_CAPSULE | Freq: Every day | ORAL | 3 refills | Status: AC

## 2023-12-30 NOTE — Progress Notes (Signed)
 Subjective:  Patient ID: Bonnie Bell, female    DOB: May 01, 1985  Age: 38 y.o. MRN: [id number]  Chief Complaint  Patient presents with   Medical Management of Chronic Issues    HPI Pt in today for follow up of anxiety with depression - she states that this time of year is hard for her - around same time when she lost her son - she feels that the lexapro  is not working anymore and causing her to feel groggy and would like to try another medication She does use ativan  as needed  Pt did have follow up brain scan recently which showed no new changes.  She continues to follow with neurology every 6 months for follow up of her brain tumor (had resection of grade 2 meningioma) She states since having that done she also does not have hardly any more headaches     12/30/2023    9:05 AM 06/30/2023    9:12 AM 12/22/2022    8:27 AM 10/20/2022    9:56 AM 08/05/2022    2:29 PM  Depression screen PHQ 2/9  Decreased Interest 1 2 0 1 1  Down, Depressed, Hopeless 0 1 0 3 0  PHQ - 2 Score 1 3 0 4 1  Altered sleeping 2 2 0 3 0  Tired, decreased energy 3 2 0 2 0  Change in appetite 0 0  3 0  Feeling bad or failure about yourself  0 0 0 3 0  Trouble concentrating 2 1 0 3 1  Moving slowly or fidgety/restless 0 0 0 3 0  Suicidal thoughts 0 0 0 3 0  PHQ-9 Score 8 8 0 24 2  Difficult doing work/chores Somewhat difficult  Not difficult at all  Not difficult at all        08/05/2022    2:29 PM 10/20/2022    9:56 AM 12/22/2022    8:26 AM 06/30/2023    9:12 AM 12/30/2023    9:05 AM  Fall Risk  Falls in the past year? 0 0 0 0 0  Was there an injury with Fall? 0 0 0 0 0  Fall Risk Category Calculator 0 0 0 0 0  Patient at Risk for Falls Due to No Fall Risks No Fall Risks No Fall Risks No Fall Risks No Fall Risks  Fall risk Follow up Falls evaluation completed Falls evaluation completed Falls evaluation completed  Falls evaluation completed    CONSTITUTIONAL: Negative for chills, fatigue, fever,    CARDIOVASCULAR: Negative for chest pain, RESPIRATORY: Negative for recent cough and dyspnea.  NEUROLOGICAL: Negative for dizziness and headaches.  PSYCHIATRIC: Negative for sleep disturbance and to question depression screen.  Negative for depression, negative for anhedonia.        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   Headache    Objective:  PHYSICAL EXAM:   VS: BP 102/60   Pulse 87   Temp 97.7 F (36.5 C)   Ht 4' 9 (1.448  m)   Wt 125 lb 9.6 oz (57 kg)   SpO2 98%   BMI 27.18 kg/m   GEN: Well nourished, well developed, in no acute distress   Cardiac: RRR; no murmurs, rubs, or gallops,no edema -  Respiratory:  normal respiratory rate and pattern with no distress - normal breath sounds with no rales, rhonchi, wheezes or rubs MS: no deformity or atrophy  Skin: warm and dry, no rash  Neuro:  Alert and Oriented x 3, - CN II-Xii grossly intact Psych: euthymic mood, appropriate affect and demeanor   Assessment & Plan:    Anxiety with depression Stop lexapro  Rx cymbalta 30mg  qd Brain tumor (HCC) Follow up with neurology as directed    Follow-up: Return in about 4 months (around 05/01/2024) for fasting physical and pap.  An After Visit Summary was printed and given to the patient.  CAMIE JONELLE NICHOLAUS DEVONNA [facility] [phone]

## 2023-12-31 ENCOUNTER — Ambulatory Visit: Admitting: Physician Assistant

## 2024-01-11 ENCOUNTER — Other Ambulatory Visit: Payer: Self-pay | Admitting: Physician Assistant

## 2024-01-11 ENCOUNTER — Telehealth: Payer: Self-pay | Admitting: Physician Assistant

## 2024-01-11 DIAGNOSIS — F418 Other specified anxiety disorders: Secondary | ICD-10-CM

## 2024-01-11 NOTE — Telephone Encounter (Signed)
 Called and was unable to leave a voicemail so sent a MyChart message for patient to call to reschedule the appointment for 06/30/24 due to the provider being out of the office.

## 2024-02-25 DIAGNOSIS — F4310 Post-traumatic stress disorder, unspecified: Secondary | ICD-10-CM | POA: Diagnosis not present

## 2024-02-25 DIAGNOSIS — F4381 Prolonged grief disorder: Secondary | ICD-10-CM | POA: Diagnosis not present

## 2024-06-30 ENCOUNTER — Encounter: Admitting: Physician Assistant

## 2024-07-03 ENCOUNTER — Encounter: Admitting: Physician Assistant
